# Patient Record
Sex: Male | Born: 1975 | Race: White | Hispanic: No | Marital: Married | Smoking: Never smoker
Health system: Southern US, Community
[De-identification: ages and names within clinical notes are randomized; demographics above are authoritative.]

## PROBLEM LIST (undated history)

## (undated) HISTORY — PX: SHOULDER ARTHROTOMY: SUR111

## (undated) HISTORY — PX: HERNIA REPAIR: SHX51

## (undated) HISTORY — PX: BILATERAL HEMI SHOULDER ARTHROPLASTY: SHX6442

## (undated) HISTORY — PX: TONSILLECTOMY: SUR1361

## (undated) HISTORY — PX: SHOULDER ARTHROSCOPY: SHX128

---

## 2013-06-24 DIAGNOSIS — G4700 Insomnia, unspecified: Secondary | ICD-10-CM | POA: Insufficient documentation

## 2013-06-24 DIAGNOSIS — M25519 Pain in unspecified shoulder: Secondary | ICD-10-CM | POA: Insufficient documentation

## 2017-04-09 DIAGNOSIS — H7292 Unspecified perforation of tympanic membrane, left ear: Secondary | ICD-10-CM | POA: Insufficient documentation

## 2017-12-01 DIAGNOSIS — Z9889 Other specified postprocedural states: Secondary | ICD-10-CM | POA: Insufficient documentation

## 2017-12-01 DIAGNOSIS — M25511 Pain in right shoulder: Secondary | ICD-10-CM | POA: Insufficient documentation

## 2018-03-29 DIAGNOSIS — M19011 Primary osteoarthritis, right shoulder: Secondary | ICD-10-CM | POA: Insufficient documentation

## 2019-07-04 ENCOUNTER — Other Ambulatory Visit (HOSPITAL_COMMUNITY)
Admission: RE | Admit: 2019-07-04 | Discharge: 2019-07-04 | Disposition: A | Discharge status: Home / Self Care | Payer: Self-pay | Source: Other Acute Inpatient Hospital

## 2019-07-04 DIAGNOSIS — Z96611 Presence of right artificial shoulder joint: Secondary | ICD-10-CM | POA: Insufficient documentation

## 2019-07-04 LAB — CBC WITH DIFFERENTIAL/PLATELET
Abs Immature Granulocytes: 0.01 10*3/uL (ref 0.00–0.07)
Basophils Absolute: 0.1 10*3/uL (ref 0.0–0.1)
Basophils Relative: 2 %
Eosinophils Absolute: 0.3 10*3/uL (ref 0.0–0.5)
Eosinophils Relative: 5 %
HCT: 40.2 % (ref 39.0–52.0)
Hemoglobin: 13.2 g/dL (ref 13.0–17.0)
Immature Granulocytes: 0 %
Lymphocytes Relative: 35 %
Lymphs Abs: 1.9 10*3/uL (ref 0.7–4.0)
MCH: 28.8 pg (ref 26.0–34.0)
MCHC: 32.8 g/dL (ref 30.0–36.0)
MCV: 87.8 fL (ref 80.0–100.0)
Monocytes Absolute: 0.4 10*3/uL (ref 0.1–1.0)
Monocytes Relative: 8 %
Neutro Abs: 2.8 10*3/uL (ref 1.7–7.7)
Neutrophils Relative %: 50 %
Platelets: 328 10*3/uL (ref 150–400)
RBC: 4.58 MIL/uL (ref 4.22–5.81)
RDW: 12.2 % (ref 11.5–15.5)
WBC: 5.5 10*3/uL (ref 4.0–10.5)
nRBC: 0 % (ref 0.0–0.2)

## 2019-07-04 LAB — COMPREHENSIVE METABOLIC PANEL
ALT: 30 U/L (ref 0–44)
AST: 22 U/L (ref 15–41)
Albumin: 4.3 g/dL (ref 3.5–5.0)
Alkaline Phosphatase: 54 U/L (ref 38–126)
Anion gap: 9 (ref 5–15)
BUN: 20 mg/dL (ref 6–20)
CO2: 26 mmol/L (ref 22–32)
Calcium: 9.2 mg/dL (ref 8.9–10.3)
Chloride: 105 mmol/L (ref 98–111)
Creatinine, Ser: 0.94 mg/dL (ref 0.61–1.24)
GFR calc Af Amer: >60 mL/min (ref >60)
GFR calc non Af Amer: >60 mL/min (ref >60)
Glucose, Bld: 111 mg/dL — ABNORMAL HIGH (ref 70–99)
Potassium: 3.7 mmol/L (ref 3.5–5.1)
Sodium: 140 mmol/L (ref 135–145)
Total Bilirubin: 0.3 mg/dL (ref 0.3–1.2)
Total Protein: 7.3 g/dL (ref 6.5–8.1)

## 2019-07-04 LAB — SEDIMENTATION RATE: Sed Rate: 1 mm/hr (ref 0–16)

## 2019-07-04 LAB — C-REACTIVE PROTEIN: CRP: 1 mg/dL — ABNORMAL HIGH (ref <1.0)

## 2019-07-11 ENCOUNTER — Other Ambulatory Visit (HOSPITAL_COMMUNITY)
Admission: RE | Admit: 2019-07-11 | Discharge: 2019-07-11 | Disposition: A | Discharge status: Home / Self Care | Payer: Self-pay | Source: Other Acute Inpatient Hospital | Attending: Surgical | Admitting: Surgical

## 2019-07-11 DIAGNOSIS — Z029 Encounter for administrative examinations, unspecified: Secondary | ICD-10-CM | POA: Insufficient documentation

## 2019-07-11 LAB — COMPREHENSIVE METABOLIC PANEL
ALT: 52 U/L — ABNORMAL HIGH (ref 0–44)
AST: 35 U/L (ref 15–41)
Albumin: 4.6 g/dL (ref 3.5–5.0)
Alkaline Phosphatase: 57 U/L (ref 38–126)
Anion gap: 12 (ref 5–15)
BUN: 23 mg/dL — ABNORMAL HIGH (ref 6–20)
CO2: 25 mmol/L (ref 22–32)
Calcium: 9.4 mg/dL (ref 8.9–10.3)
Chloride: 103 mmol/L (ref 98–111)
Creatinine, Ser: 1.01 mg/dL (ref 0.61–1.24)
GFR calc Af Amer: >60 mL/min (ref >60)
GFR calc non Af Amer: >60 mL/min (ref >60)
Glucose, Bld: 91 mg/dL (ref 70–99)
Potassium: 3.8 mmol/L (ref 3.5–5.1)
Sodium: 140 mmol/L (ref 135–145)
Total Bilirubin: 0.5 mg/dL (ref 0.3–1.2)
Total Protein: 7.1 g/dL (ref 6.5–8.1)

## 2019-07-11 LAB — CBC WITH DIFFERENTIAL/PLATELET
Abs Immature Granulocytes: 0.01 10*3/uL (ref 0.00–0.07)
Basophils Absolute: 0.1 10*3/uL (ref 0.0–0.1)
Basophils Relative: 1 %
Eosinophils Absolute: 0.3 10*3/uL (ref 0.0–0.5)
Eosinophils Relative: 4 %
HCT: 39.8 % (ref 39.0–52.0)
Hemoglobin: 13.4 g/dL (ref 13.0–17.0)
Immature Granulocytes: 0 %
Lymphocytes Relative: 30 %
Lymphs Abs: 1.9 10*3/uL (ref 0.7–4.0)
MCH: 29.3 pg (ref 26.0–34.0)
MCHC: 33.7 g/dL (ref 30.0–36.0)
MCV: 86.9 fL (ref 80.0–100.0)
Monocytes Absolute: 0.6 10*3/uL (ref 0.1–1.0)
Monocytes Relative: 9 %
Neutro Abs: 3.6 10*3/uL (ref 1.7–7.7)
Neutrophils Relative %: 56 %
Platelets: 293 10*3/uL (ref 150–400)
RBC: 4.58 MIL/uL (ref 4.22–5.81)
RDW: 12.4 % (ref 11.5–15.5)
WBC: 6.4 10*3/uL (ref 4.0–10.5)
nRBC: 0 % (ref 0.0–0.2)

## 2019-07-11 LAB — C-REACTIVE PROTEIN: CRP: 0.8 mg/dL (ref <1.0)

## 2019-07-12 LAB — SEDIMENTATION RATE: Sed Rate: 1 mm/hr (ref 0–16)

## 2019-07-18 ENCOUNTER — Other Ambulatory Visit (HOSPITAL_COMMUNITY)
Admission: RE | Admit: 2019-07-18 | Discharge: 2019-07-18 | Disposition: A | Discharge status: Home / Self Care | Payer: Self-pay | Source: Ambulatory Visit | Attending: Surgical | Admitting: Surgical

## 2019-07-18 DIAGNOSIS — Z96611 Presence of right artificial shoulder joint: Secondary | ICD-10-CM | POA: Insufficient documentation

## 2019-07-18 LAB — CBC WITH DIFFERENTIAL/PLATELET
Abs Immature Granulocytes: 0.01 10*3/uL (ref 0.00–0.07)
Basophils Absolute: 0.1 10*3/uL (ref 0.0–0.1)
Basophils Relative: 1 %
Eosinophils Absolute: 0.3 10*3/uL (ref 0.0–0.5)
Eosinophils Relative: 8 %
HCT: 40.2 % (ref 39.0–52.0)
Hemoglobin: 13.6 g/dL (ref 13.0–17.0)
Immature Granulocytes: 0 %
Lymphocytes Relative: 41 %
Lymphs Abs: 1.9 10*3/uL (ref 0.7–4.0)
MCH: 29.3 pg (ref 26.0–34.0)
MCHC: 33.8 g/dL (ref 30.0–36.0)
MCV: 86.6 fL (ref 80.0–100.0)
Monocytes Absolute: 0.5 10*3/uL (ref 0.1–1.0)
Monocytes Relative: 10 %
Neutro Abs: 1.8 10*3/uL (ref 1.7–7.7)
Neutrophils Relative %: 40 %
Platelets: 231 10*3/uL (ref 150–400)
RBC: 4.64 MIL/uL (ref 4.22–5.81)
RDW: 12.6 % (ref 11.5–15.5)
WBC: 4.5 10*3/uL (ref 4.0–10.5)
nRBC: 0 % (ref 0.0–0.2)

## 2019-07-18 LAB — COMPREHENSIVE METABOLIC PANEL
ALT: 44 U/L (ref 0–44)
AST: 28 U/L (ref 15–41)
Albumin: 4.4 g/dL (ref 3.5–5.0)
Alkaline Phosphatase: 57 U/L (ref 38–126)
Anion gap: 8 (ref 5–15)
BUN: 20 mg/dL (ref 6–20)
CO2: 25 mmol/L (ref 22–32)
Calcium: 9.3 mg/dL (ref 8.9–10.3)
Chloride: 107 mmol/L (ref 98–111)
Creatinine, Ser: 0.98 mg/dL (ref 0.61–1.24)
GFR calc Af Amer: >60 mL/min (ref >60)
GFR calc non Af Amer: >60 mL/min (ref >60)
Glucose, Bld: 137 mg/dL — ABNORMAL HIGH (ref 70–99)
Potassium: 3.5 mmol/L (ref 3.5–5.1)
Sodium: 140 mmol/L (ref 135–145)
Total Bilirubin: 0.2 mg/dL — ABNORMAL LOW (ref 0.3–1.2)
Total Protein: 7.1 g/dL (ref 6.5–8.1)

## 2019-07-18 LAB — SEDIMENTATION RATE: Sed Rate: 2 mm/hr (ref 0–16)

## 2019-07-18 LAB — C-REACTIVE PROTEIN: CRP: 0.9 mg/dL (ref <1.0)

## 2019-07-25 ENCOUNTER — Other Ambulatory Visit (HOSPITAL_COMMUNITY)
Admission: RE | Admit: 2019-07-25 | Discharge: 2019-07-25 | Disposition: A | Discharge status: Home / Self Care | Payer: Self-pay | Source: Other Acute Inpatient Hospital | Attending: Surgical | Admitting: Surgical

## 2019-07-25 DIAGNOSIS — Z96611 Presence of right artificial shoulder joint: Secondary | ICD-10-CM | POA: Insufficient documentation

## 2019-07-25 LAB — COMPREHENSIVE METABOLIC PANEL
ALT: 50 U/L — ABNORMAL HIGH (ref 0–44)
AST: 30 U/L (ref 15–41)
Albumin: 4.2 g/dL (ref 3.5–5.0)
Alkaline Phosphatase: 54 U/L (ref 38–126)
Anion gap: 10 (ref 5–15)
BUN: 15 mg/dL (ref 6–20)
CO2: 26 mmol/L (ref 22–32)
Calcium: 9.2 mg/dL (ref 8.9–10.3)
Chloride: 105 mmol/L (ref 98–111)
Creatinine, Ser: 1.06 mg/dL (ref 0.61–1.24)
GFR calc Af Amer: >60 mL/min (ref >60)
GFR calc non Af Amer: >60 mL/min (ref >60)
Glucose, Bld: 105 mg/dL — ABNORMAL HIGH (ref 70–99)
Potassium: 3.9 mmol/L (ref 3.5–5.1)
Sodium: 141 mmol/L (ref 135–145)
Total Bilirubin: 0.5 mg/dL (ref 0.3–1.2)
Total Protein: 6.7 g/dL (ref 6.5–8.1)

## 2019-07-25 LAB — CBC WITH DIFFERENTIAL/PLATELET
Abs Immature Granulocytes: 0.02 10*3/uL (ref 0.00–0.07)
Basophils Absolute: 0 10*3/uL (ref 0.0–0.1)
Basophils Relative: 1 %
Eosinophils Absolute: 0.3 10*3/uL (ref 0.0–0.5)
Eosinophils Relative: 5 %
HCT: 38.3 % — ABNORMAL LOW (ref 39.0–52.0)
Hemoglobin: 12.9 g/dL — ABNORMAL LOW (ref 13.0–17.0)
Immature Granulocytes: 0 %
Lymphocytes Relative: 35 %
Lymphs Abs: 1.7 10*3/uL (ref 0.7–4.0)
MCH: 28.7 pg (ref 26.0–34.0)
MCHC: 33.7 g/dL (ref 30.0–36.0)
MCV: 85.3 fL (ref 80.0–100.0)
Monocytes Absolute: 0.4 10*3/uL (ref 0.1–1.0)
Monocytes Relative: 8 %
Neutro Abs: 2.5 10*3/uL (ref 1.7–7.7)
Neutrophils Relative %: 51 %
Platelets: 254 10*3/uL (ref 150–400)
RBC: 4.49 MIL/uL (ref 4.22–5.81)
RDW: 12.6 % (ref 11.5–15.5)
WBC: 5 10*3/uL (ref 4.0–10.5)
nRBC: 0 % (ref 0.0–0.2)

## 2019-07-25 LAB — SEDIMENTATION RATE: Sed Rate: 1 mm/hr (ref 0–16)

## 2019-09-28 DIAGNOSIS — G894 Chronic pain syndrome: Secondary | ICD-10-CM | POA: Insufficient documentation

## 2019-12-02 DIAGNOSIS — Z96611 Presence of right artificial shoulder joint: Secondary | ICD-10-CM | POA: Insufficient documentation

## 2020-07-10 ENCOUNTER — Other Ambulatory Visit: Payer: Self-pay | Admitting: Orthopedic Surgery

## 2020-07-31 NOTE — Progress Notes (Signed)
Pt. Needs orders for upcomming surgery.PST and lab. appointment on: 08/01/20. Thanks.

## 2020-07-31 NOTE — Patient Instructions (Addendum)
DUE TO COVID-19 ONLY ONE VISITOR IS ALLOWED TO COME WITH YOU AND STAY IN THE WAITING ROOM ONLY DURING PRE OP AND PROCEDURE DAY OF SURGERY. THE 1 VISITOR  MAY VISIT WITH YOU AFTER SURGERY IN YOUR PRIVATE ROOM DURING VISITING HOURS ONLY!  YOU NEED TO HAVE A COVID 19 TEST ON: 08/06/20 @ 9:00 AM , THIS TEST MUST BE DONE BEFORE SURGERY,  COVID TESTING SITE [address] [place] [address], IT IS ON THE RIGHT GOING OUT [address] APPROXIMATELY  2 MINUTES PAST [employer] ON THE RIGHT. ONCE YOUR COVID TEST IS COMPLETED,  PLEASE BEGIN THE QUARANTINE INSTRUCTIONS AS OUTLINED IN YOUR HANDOUT.                Darren Caldwell    Your procedure is scheduled on: 08/09/20   Report to [facility] Main  Entrance   Report to short stay at: 5:30 AM     Call this number if you have problems the morning of surgery [phone]    Remember: Do not eat food or drink liquids :After Midnight.   BRUSH YOUR TEETH MORNING OF SURGERY AND RINSE YOUR MOUTH OUT, NO CHEWING GUM CANDY OR MINTS.                                You may not have any metal on your body including hair pins and              piercings  Do not wear jewelry, lotions, powders or perfumes, deodorant             Men may shave face and neck.   Do not bring valuables to the hospital. [facility] IS NOT             RESPONSIBLE   FOR VALUABLES.  Contacts, dentures or bridgework may not be worn into surgery.  Leave suitcase in the car. After surgery it may be brought to your room.     Patients discharged the day of surgery will not be allowed to drive home. IF YOU ARE HAVING SURGERY AND GOING HOME THE SAME DAY, YOU MUST HAVE AN ADULT TO DRIVE YOU HOME AND BE WITH YOU FOR 24 HOURS. YOU MAY GO HOME BY TAXI OR UBER OR ORTHERWISE, BUT AN ADULT MUST ACCOMPANY YOU HOME AND STAY WITH YOU FOR 24 HOURS.  Name and phone number of your driver:  Special Instructions: N/A              Please read over the following fact sheets  you were given: _____________________________________________________________________         [facility] - Preparing for Surgery Before surgery, you can play an important role.  Because skin is not sterile, your skin needs to be as free of germs as possible.  You can reduce the number of germs on your skin by washing with CHG (chlorahexidine gluconate) soap before surgery.  CHG is an antiseptic cleaner which kills germs and bonds with the skin to continue killing germs even after washing. Please DO NOT use if you have an allergy to CHG or antibacterial soaps.  If your skin becomes reddened/irritated stop using the CHG and inform your nurse when you arrive at Short Stay. Do not shave (including legs and underarms) for at least 48 hours prior to the first CHG shower.  You may shave your face/neck. Please follow these instructions carefully:  1.  Shower with CHG Soap the night before surgery and the  morning of Surgery.  2.  If you choose to wash your hair, wash your hair first as usual with your  normal  shampoo.  3.  After you shampoo, rinse your hair and body thoroughly to remove the  shampoo.                           4.  Use CHG as you would any other liquid soap.  You can apply chg directly  to the skin and wash                       Gently with a scrungie or clean washcloth.  5.  Apply the CHG Soap to your body ONLY FROM THE NECK DOWN.   Do not use on face/ open                           Wound or open sores. Avoid contact with eyes, ears mouth and genitals (private parts).                       Wash face,  Genitals (private parts) with your normal soap.             6.  Wash thoroughly, paying special attention to the area where your surgery  will be performed.  7.  Thoroughly rinse your body with warm water from the neck down.  8.  DO NOT shower/wash with your normal soap after using and rinsing off  the CHG Soap.                9.  Pat yourself dry with a clean towel.            10.  Wear  clean pajamas.            11.  Place clean sheets on your bed the night of your first shower and do not  sleep with pets. Day of Surgery : Do not apply any lotions/deodorants the morning of surgery.  Please wear clean clothes to the hospital/surgery center.  FAILURE TO FOLLOW THESE INSTRUCTIONS MAY RESULT IN THE CANCELLATION OF YOUR SURGERY PATIENT SIGNATURE_________________________________  NURSE SIGNATURE__________________________________  ________________________________________________________________________  [facility]- Preparing for Total Shoulder Arthroplasty    Before surgery, you can play an important role. Because skin is not sterile, your skin needs to be as free of germs as possible. You can reduce the number of germs on your skin by using the following products. . Benzoyl Peroxide Gel o Reduces the number of germs present on the skin o Applied twice a day to shoulder area starting two days before surgery    ==================================================================  Please follow these instructions carefully:  BENZOYL PEROXIDE 5% GEL  Please do not use if you have an allergy to benzoyl peroxide.   If your skin becomes reddened/irritated stop using the benzoyl peroxide.  Starting two days before surgery, apply as follows: 1. Apply benzoyl peroxide in the morning and at night. Apply after taking a shower. If you are not taking a shower clean entire shoulder front, back, and side along with the armpit with a clean wet washcloth.  2. Place a quarter-sized dollop on your shoulder and rub in thoroughly, making sure to cover the front, back, and side of your shoulder, along with the armpit.   2  days before ____ AM   ____ PM              1 day before ____ AM   ____ PM                         3. Do this twice a day for two days.  (Last application is the night before surgery, AFTER using the CHG soap as described below).  4. Do NOT apply benzoyl peroxide gel on the  day of surgery.

## 2020-08-01 ENCOUNTER — Encounter (HOSPITAL_COMMUNITY): Payer: Self-pay

## 2020-08-01 ENCOUNTER — Other Ambulatory Visit: Payer: Self-pay

## 2020-08-01 ENCOUNTER — Encounter (HOSPITAL_COMMUNITY)
Admission: RE | Admit: 2020-08-01 | Discharge: 2020-08-01 | Disposition: A | Discharge status: Home / Self Care | Payer: 59 | Source: Ambulatory Visit | Attending: Orthopedic Surgery | Admitting: Orthopedic Surgery

## 2020-08-01 DIAGNOSIS — Z01812 Encounter for preprocedural laboratory examination: Secondary | ICD-10-CM | POA: Diagnosis not present

## 2020-08-01 LAB — CBC
HCT: 43.4 % (ref 39.0–52.0)
Hemoglobin: 14.9 g/dL (ref 13.0–17.0)
MCH: 30.3 pg (ref 26.0–34.0)
MCHC: 34.3 g/dL (ref 30.0–36.0)
MCV: 88.4 fL (ref 80.0–100.0)
Platelets: 294 10*3/uL (ref 150–400)
RBC: 4.91 MIL/uL (ref 4.22–5.81)
RDW: 12.1 % (ref 11.5–15.5)
WBC: 6 10*3/uL (ref 4.0–10.5)
nRBC: 0 % (ref 0.0–0.2)

## 2020-08-01 LAB — SURGICAL PCR SCREEN
MRSA, PCR: NEGATIVE
Staphylococcus aureus: POSITIVE — AB

## 2020-08-01 NOTE — Progress Notes (Signed)
COVID Vaccine Completed: yES Date COVID Vaccine completed: 01/19/20 COVID vaccine manufacturer:    Moderna    PCP - nO pcp Cardiologist -   Chest x-ray -  EKG -  Stress Test -  ECHO -  Cardiac Cath -  Pacemaker/ICD device last checked:  Sleep Study -  CPAP -   Fasting Blood Sugar -  Checks Blood Sugar _____ times a day  Blood Thinner Instructions: Aspirin Instructions: Last Dose:  Anesthesia review:   Patient denies shortness of breath, fever, cough and chest pain at PAT appointment   Patient verbalized understanding of instructions that were given to them at the PAT appointment. Patient was also instructed that they will need to review over the PAT instructions again at home before surgery.

## 2020-08-02 NOTE — Progress Notes (Signed)
PCR: POSITIVE STAPH. 

## 2020-08-06 ENCOUNTER — Other Ambulatory Visit (HOSPITAL_COMMUNITY)
Admission: RE | Admit: 2020-08-06 | Discharge: 2020-08-06 | Disposition: A | Discharge status: Home / Self Care | Payer: 59 | Source: Ambulatory Visit | Attending: Orthopedic Surgery | Admitting: Orthopedic Surgery

## 2020-08-06 DIAGNOSIS — Z01812 Encounter for preprocedural laboratory examination: Secondary | ICD-10-CM | POA: Insufficient documentation

## 2020-08-06 DIAGNOSIS — Z20822 Contact with and (suspected) exposure to covid-19: Secondary | ICD-10-CM | POA: Insufficient documentation

## 2020-08-06 LAB — SARS CORONAVIRUS 2 (TAT 6-24 HRS): SARS Coronavirus 2: NEGATIVE

## 2020-08-08 ENCOUNTER — Other Ambulatory Visit: Payer: Self-pay | Admitting: Orthopedic Surgery

## 2020-08-08 NOTE — Progress Notes (Signed)
Called [facility] and requested orders.

## 2020-08-09 ENCOUNTER — Ambulatory Visit (HOSPITAL_COMMUNITY): Payer: 59 | Admitting: Certified Registered"

## 2020-08-09 ENCOUNTER — Encounter (HOSPITAL_COMMUNITY): Payer: Self-pay | Admitting: Orthopedic Surgery

## 2020-08-09 ENCOUNTER — Ambulatory Visit (HOSPITAL_COMMUNITY): Payer: 59

## 2020-08-09 ENCOUNTER — Ambulatory Visit (HOSPITAL_COMMUNITY)
Admission: RE | Admit: 2020-08-09 | Discharge: 2020-08-09 | Disposition: A | Discharge status: Home / Self Care | Payer: 59 | Source: Ambulatory Visit | Attending: Orthopedic Surgery | Admitting: Orthopedic Surgery

## 2020-08-09 ENCOUNTER — Encounter (HOSPITAL_COMMUNITY)
Admission: RE | Disposition: A | Discharge status: Home / Self Care | Payer: Self-pay | Source: Ambulatory Visit | Attending: Orthopedic Surgery

## 2020-08-09 DIAGNOSIS — Z96611 Presence of right artificial shoulder joint: Secondary | ICD-10-CM | POA: Diagnosis not present

## 2020-08-09 DIAGNOSIS — M25511 Pain in right shoulder: Secondary | ICD-10-CM | POA: Insufficient documentation

## 2020-08-09 DIAGNOSIS — M25311 Other instability, right shoulder: Secondary | ICD-10-CM | POA: Insufficient documentation

## 2020-08-09 DIAGNOSIS — M25811 Other specified joint disorders, right shoulder: Secondary | ICD-10-CM | POA: Insufficient documentation

## 2020-08-09 DIAGNOSIS — Z01811 Encounter for preprocedural respiratory examination: Secondary | ICD-10-CM

## 2020-08-09 HISTORY — PX: REVISION TOTAL SHOULDER TO REVERSE TOTAL SHOULDER: SHX6313

## 2020-08-09 LAB — CBC WITH DIFFERENTIAL/PLATELET
Abs Immature Granulocytes: 0.02 10*3/uL (ref 0.00–0.07)
Basophils Absolute: 0.1 10*3/uL (ref 0.0–0.1)
Basophils Relative: 1 %
Eosinophils Absolute: 0.3 10*3/uL (ref 0.0–0.5)
Eosinophils Relative: 4 %
HCT: 42.9 % (ref 39.0–52.0)
Hemoglobin: 15 g/dL (ref 13.0–17.0)
Immature Granulocytes: 0 %
Lymphocytes Relative: 40 %
Lymphs Abs: 2.7 10*3/uL (ref 0.7–4.0)
MCH: 30.6 pg (ref 26.0–34.0)
MCHC: 35 g/dL (ref 30.0–36.0)
MCV: 87.6 fL (ref 80.0–100.0)
Monocytes Absolute: 0.6 10*3/uL (ref 0.1–1.0)
Monocytes Relative: 9 %
Neutro Abs: 3.2 10*3/uL (ref 1.7–7.7)
Neutrophils Relative %: 46 %
Platelets: 256 10*3/uL (ref 150–400)
RBC: 4.9 MIL/uL (ref 4.22–5.81)
RDW: 12.3 % (ref 11.5–15.5)
WBC: 6.9 10*3/uL (ref 4.0–10.5)
nRBC: 0 % (ref 0.0–0.2)

## 2020-08-09 LAB — URINALYSIS, COMPLETE (UACMP) WITH MICROSCOPIC
Bilirubin Urine: NEGATIVE
Glucose, UA: NEGATIVE mg/dL
Hgb urine dipstick: NEGATIVE
Ketones, ur: NEGATIVE mg/dL
Leukocytes,Ua: NEGATIVE
Nitrite: NEGATIVE
Protein, ur: NEGATIVE mg/dL
Specific Gravity, Urine: 1.021 (ref 1.005–1.030)
pH: 5 (ref 5.0–8.0)

## 2020-08-09 LAB — COMPREHENSIVE METABOLIC PANEL
ALT: 47 U/L — ABNORMAL HIGH (ref 0–44)
AST: 29 U/L (ref 15–41)
Albumin: 4.4 g/dL (ref 3.5–5.0)
Alkaline Phosphatase: 47 U/L (ref 38–126)
Anion gap: 12 (ref 5–15)
BUN: 19 mg/dL (ref 6–20)
CO2: 26 mmol/L (ref 22–32)
Calcium: 9.8 mg/dL (ref 8.9–10.3)
Chloride: 104 mmol/L (ref 98–111)
Creatinine, Ser: 1.08 mg/dL (ref 0.61–1.24)
GFR, Estimated: >60 mL/min (ref >60)
Glucose, Bld: 101 mg/dL — ABNORMAL HIGH (ref 70–99)
Potassium: 4.1 mmol/L (ref 3.5–5.1)
Sodium: 142 mmol/L (ref 135–145)
Total Bilirubin: 0.7 mg/dL (ref 0.3–1.2)
Total Protein: 7.2 g/dL (ref 6.5–8.1)

## 2020-08-09 LAB — ABO/RH: ABO/RH(D): O POS

## 2020-08-09 LAB — PROTIME-INR
INR: 1 (ref 0.8–1.2)
Prothrombin Time: 12.5 seconds (ref 11.4–15.2)

## 2020-08-09 LAB — TYPE AND SCREEN
ABO/RH(D): O POS
Antibody Screen: NEGATIVE

## 2020-08-09 LAB — APTT: aPTT: 31 seconds (ref 24–36)

## 2020-08-09 SURGERY — REVISION, REVERSE TOTAL ARTHROPLASTY, SHOULDER
Anesthesia: General | Site: Shoulder | Laterality: Right

## 2020-08-09 MED ORDER — PHENYLEPHRINE HCL-NACL 10-0.9 MG/250ML-% IV SOLN
INTRAVENOUS | Status: AC
  Filled 2020-08-09: qty 750

## 2020-08-09 MED ORDER — BUPIVACAINE HCL 0.5 % IJ SOLN
INTRAMUSCULAR | Status: DC | PRN
  Administered 2020-08-09: 15 mL

## 2020-08-09 MED ORDER — LACTATED RINGERS IV SOLN: INTRAVENOUS | Status: DC

## 2020-08-09 MED ORDER — MIDAZOLAM HCL 2 MG/2ML IJ SOLN
INTRAMUSCULAR | Status: DC | PRN
  Administered 2020-08-09: 2 mg via INTRAVENOUS

## 2020-08-09 MED ORDER — ONDANSETRON HCL 4 MG/2ML IJ SOLN
INTRAMUSCULAR | Status: DC | PRN
  Administered 2020-08-09: 4 mg via INTRAVENOUS

## 2020-08-09 MED ORDER — PHENYLEPHRINE 40 MCG/ML (10ML) SYRINGE FOR IV PUSH (FOR BLOOD PRESSURE SUPPORT)
PREFILLED_SYRINGE | INTRAVENOUS | Status: AC
  Filled 2020-08-09: qty 10

## 2020-08-09 MED ORDER — BUPIVACAINE HCL 0.5 % IJ SOLN: INTRAMUSCULAR | Status: DC | PRN

## 2020-08-09 MED ORDER — HYDROMORPHONE HCL 1 MG/ML IJ SOLN: 0.2500 mg | INTRAMUSCULAR | Status: DC | PRN

## 2020-08-09 MED ORDER — CEFAZOLIN SODIUM-DEXTROSE 2-4 GM/100ML-% IV SOLN
INTRAVENOUS | Status: AC
  Filled 2020-08-09: qty 100

## 2020-08-09 MED ORDER — EPHEDRINE SULFATE-NACL 50-0.9 MG/10ML-% IV SOSY
PREFILLED_SYRINGE | INTRAVENOUS | Status: DC | PRN
  Administered 2020-08-09 (×4): 5 mg via INTRAVENOUS

## 2020-08-09 MED ORDER — DEXAMETHASONE SODIUM PHOSPHATE 10 MG/ML IJ SOLN
INTRAMUSCULAR | Status: AC
  Filled 2020-08-09: qty 1

## 2020-08-09 MED ORDER — ORAL CARE MOUTH RINSE: 15.0000 mL | Freq: Once | OROMUCOSAL | Status: AC

## 2020-08-09 MED ORDER — ONDANSETRON HCL 4 MG/2ML IJ SOLN
INTRAMUSCULAR | Status: AC
  Filled 2020-08-09: qty 2

## 2020-08-09 MED ORDER — LIDOCAINE 2% (20 MG/ML) 5 ML SYRINGE
INTRAMUSCULAR | Status: DC | PRN
  Administered 2020-08-09: 40 mg via INTRAVENOUS

## 2020-08-09 MED ORDER — PHENYLEPHRINE HCL-NACL 10-0.9 MG/250ML-% IV SOLN
INTRAVENOUS | Status: DC | PRN
  Administered 2020-08-09: 20 ug/min via INTRAVENOUS

## 2020-08-09 MED ORDER — SUGAMMADEX SODIUM 200 MG/2ML IV SOLN
INTRAVENOUS | Status: DC | PRN
  Administered 2020-08-09: 200 mg via INTRAVENOUS

## 2020-08-09 MED ORDER — LACTATED RINGERS IV BOLUS
500.0000 mL | Freq: Once | INTRAVENOUS | Status: AC
  Administered 2020-08-09: 500 mL via INTRAVENOUS

## 2020-08-09 MED ORDER — ROCURONIUM BROMIDE 10 MG/ML (PF) SYRINGE
PREFILLED_SYRINGE | INTRAVENOUS | Status: AC
  Filled 2020-08-09: qty 10

## 2020-08-09 MED ORDER — PROPOFOL 10 MG/ML IV BOLUS
INTRAVENOUS | Status: AC
  Filled 2020-08-09: qty 40

## 2020-08-09 MED ORDER — FENTANYL CITRATE (PF) 100 MCG/2ML IJ SOLN
INTRAMUSCULAR | Status: AC
  Filled 2020-08-09: qty 2

## 2020-08-09 MED ORDER — ACETAMINOPHEN 325 MG PO TABS: 325.0000 mg | ORAL_TABLET | Freq: Once | ORAL | Status: DC | PRN

## 2020-08-09 MED ORDER — PROPOFOL 10 MG/ML IV BOLUS
INTRAVENOUS | Status: DC | PRN
  Administered 2020-08-09: 160 mg via INTRAVENOUS

## 2020-08-09 MED ORDER — CHLORHEXIDINE GLUCONATE 0.12 % MT SOLN
15.0000 mL | Freq: Once | OROMUCOSAL | Status: AC
  Administered 2020-08-09: 15 mL via OROMUCOSAL

## 2020-08-09 MED ORDER — DEXAMETHASONE SODIUM PHOSPHATE 10 MG/ML IJ SOLN
INTRAMUSCULAR | Status: DC | PRN
  Administered 2020-08-09: 10 mg via INTRAVENOUS

## 2020-08-09 MED ORDER — ACETAMINOPHEN 160 MG/5ML PO SOLN: 325.0000 mg | Freq: Once | ORAL | Status: DC | PRN

## 2020-08-09 MED ORDER — BUPIVACAINE LIPOSOME 1.3 % IJ SUSP: INTRAMUSCULAR | Status: DC | PRN

## 2020-08-09 MED ORDER — BUPIVACAINE LIPOSOME 1.3 % IJ SUSP
INTRAMUSCULAR | Status: DC | PRN
  Administered 2020-08-09: 10 mL via PERINEURAL

## 2020-08-09 MED ORDER — OXYCODONE-ACETAMINOPHEN 5-325 MG PO TABS: ORAL_TABLET | ORAL | 0 refills | Status: AC

## 2020-08-09 MED ORDER — FENTANYL CITRATE (PF) 250 MCG/5ML IJ SOLN
INTRAMUSCULAR | Status: DC | PRN
  Administered 2020-08-09 (×2): 50 ug via INTRAVENOUS

## 2020-08-09 MED ORDER — TIZANIDINE HCL 4 MG PO TABS: 4.0000 mg | ORAL_TABLET | Freq: Three times a day (TID) | ORAL | 1 refills | Status: AC | PRN

## 2020-08-09 MED ORDER — PHENYLEPHRINE 40 MCG/ML (10ML) SYRINGE FOR IV PUSH (FOR BLOOD PRESSURE SUPPORT)
PREFILLED_SYRINGE | INTRAVENOUS | Status: DC | PRN
  Administered 2020-08-09: 40 ug via INTRAVENOUS
  Administered 2020-08-09 (×2): 80 ug via INTRAVENOUS

## 2020-08-09 MED ORDER — ROCURONIUM BROMIDE 10 MG/ML (PF) SYRINGE
PREFILLED_SYRINGE | INTRAVENOUS | Status: DC | PRN
  Administered 2020-08-09 (×6): 10 mg via INTRAVENOUS
  Administered 2020-08-09: 70 mg via INTRAVENOUS
  Administered 2020-08-09 (×2): 10 mg via INTRAVENOUS

## 2020-08-09 MED ORDER — AMISULPRIDE (ANTIEMETIC) 5 MG/2ML IV SOLN: 10.0000 mg | Freq: Once | INTRAVENOUS | Status: DC | PRN

## 2020-08-09 MED ORDER — LIDOCAINE 2% (20 MG/ML) 5 ML SYRINGE
INTRAMUSCULAR | Status: AC
  Filled 2020-08-09: qty 5

## 2020-08-09 MED ORDER — DOXYCYCLINE HYCLATE 50 MG PO CAPS: 100.0000 mg | ORAL_CAPSULE | Freq: Two times a day (BID) | ORAL | 0 refills | Status: DC

## 2020-08-09 MED ORDER — CEFAZOLIN SODIUM-DEXTROSE 2-4 GM/100ML-% IV SOLN
2.0000 g | INTRAVENOUS | Status: AC
  Administered 2020-08-09 (×2): 2 g via INTRAVENOUS

## 2020-08-09 MED ORDER — MEPERIDINE HCL 50 MG/ML IJ SOLN: 6.2500 mg | INTRAMUSCULAR | Status: DC | PRN

## 2020-08-09 MED ORDER — EPHEDRINE 5 MG/ML INJ
INTRAVENOUS | Status: AC
  Filled 2020-08-09: qty 10

## 2020-08-09 MED ORDER — TRANEXAMIC ACID-NACL 1000-0.7 MG/100ML-% IV SOLN
1000.0000 mg | INTRAVENOUS | Status: AC
  Administered 2020-08-09: 1000 mg via INTRAVENOUS

## 2020-08-09 MED ORDER — MIDAZOLAM HCL 2 MG/2ML IJ SOLN
INTRAMUSCULAR | Status: AC
  Filled 2020-08-09: qty 2

## 2020-08-09 MED ORDER — PHENYLEPHRINE HCL (PRESSORS) 10 MG/ML IV SOLN
INTRAVENOUS | Status: AC
  Filled 2020-08-09: qty 1

## 2020-08-09 MED ORDER — TRANEXAMIC ACID-NACL 1000-0.7 MG/100ML-% IV SOLN
INTRAVENOUS | Status: AC
  Filled 2020-08-09: qty 100

## 2020-08-09 MED ORDER — ACETAMINOPHEN 10 MG/ML IV SOLN: 1000.0000 mg | Freq: Once | INTRAVENOUS | Status: DC | PRN

## 2020-08-09 SURGICAL SUPPLY — 79 items
BAG ZIPLOCK 12X15 (MISCELLANEOUS) ×2 IMPLANT
BASEPLATE P2 COATD GLND 6.5X30 (Shoulder) ×1 IMPLANT
BIT DRILL 2.5 DIA 127 CALI (BIT) ×2 IMPLANT
BIT DRILL 4 DIA CALIBRATED (BIT) ×2 IMPLANT
BIT DRILL RSA EQUIN 2X3.2 (BIT) ×2 IMPLANT
BLADE MIC 41X13 (BLADE) IMPLANT
BLADE SAW SGTL 18X1.27X75 (BLADE) IMPLANT
BLADE SAW SGTL 83.5X18.5 (BLADE) ×2 IMPLANT
BLADE SURG 15 STRL LF DISP TIS (BLADE) ×1 IMPLANT
BLADE SURG 15 STRL SS (BLADE) ×1
BLADE SURG SZ10 CARB STEEL (BLADE) ×4 IMPLANT
CNTNR URN SCR LID CUP LEK RST (MISCELLANEOUS) IMPLANT
CONT SPEC 4OZ STRL OR WHT (MISCELLANEOUS)
COOLER ICEMAN CLASSIC (MISCELLANEOUS) ×2 IMPLANT
COVER SURGICAL LIGHT HANDLE (MISCELLANEOUS) ×2 IMPLANT
COVER WAND RF STERILE (DRAPES) IMPLANT
CUBES CANC 30CC PCANCUBE30 (Bone Implant) ×2 IMPLANT
DRAPE INCISE IOBAN 66X45 STRL (DRAPES) ×2 IMPLANT
DRAPE ORTHO SPLIT 77X108 STRL (DRAPES) ×2
DRAPE POUCH INSTRU U-SHP 10X18 (DRAPES) ×2 IMPLANT
DRAPE SURG 17X11 SM STRL (DRAPES) ×2 IMPLANT
DRAPE SURG ORHT 6 SPLT 77X108 (DRAPES) ×2 IMPLANT
DRAPE U-SHAPE 47X51 STRL (DRAPES) ×2 IMPLANT
DRSG AQUACEL AG ADV 3.5X 6 (GAUZE/BANDAGES/DRESSINGS) ×2 IMPLANT
DRSG MEPILEX BORDER 4X4 (GAUZE/BANDAGES/DRESSINGS) ×2 IMPLANT
DRSG MEPILEX BORDER 4X8 (GAUZE/BANDAGES/DRESSINGS) ×2 IMPLANT
DURAPREP 26ML APPLICATOR (WOUND CARE) ×2 IMPLANT
ELECT BLADE TIP CTD 4 INCH (ELECTRODE) ×2 IMPLANT
ELECT REM PT RETURN 15FT ADLT (MISCELLANEOUS) ×2 IMPLANT
EVACUATOR 1/8 PVC DRAIN (DRAIN) IMPLANT
FACESHIELD WRAPAROUND (MASK) ×4 IMPLANT
GLENOSPHERE RSA EQUINOXE W/CAP (Joint) ×2 IMPLANT
GLOVE BIO SURGEON STRL SZ7 (GLOVE) ×4 IMPLANT
GLOVE BIO SURGEON STRL SZ7.5 (GLOVE) ×4 IMPLANT
GLOVE BIOGEL PI IND STRL 7.0 (GLOVE) ×1 IMPLANT
GLOVE BIOGEL PI IND STRL 8 (GLOVE) ×1 IMPLANT
GLOVE BIOGEL PI INDICATOR 7.0 (GLOVE) ×1
GLOVE BIOGEL PI INDICATOR 8 (GLOVE) ×1
HANDPIECE INTERPULSE COAX TIP (DISPOSABLE) ×1
HEAD GLENOID W/SCREW 32MM (Shoulder) ×2 IMPLANT
HEMOSTAT SURGICEL 4X8 (HEMOSTASIS) IMPLANT
HOOD PEEL AWAY FLYTE STAYCOOL (MISCELLANEOUS) ×4 IMPLANT
INSERT EPOLY STND HUMERUS 36MM (Shoulder) ×2 IMPLANT
INSERT EPOLYSTD HUMERUS 36MM (Shoulder) ×1 IMPLANT
KIT BASIN OR (CUSTOM PROCEDURE TRAY) ×2 IMPLANT
KIT TURNOVER KIT A (KITS) IMPLANT
MANIFOLD NEPTUNE II (INSTRUMENTS) ×2 IMPLANT
NS IRRIG 1000ML POUR BTL (IV SOLUTION) ×2 IMPLANT
P2 COATDE GLNOID BSEPLT 6.5X30 (Shoulder) ×2 IMPLANT
PACK SHOULDER (CUSTOM PROCEDURE TRAY) ×2 IMPLANT
PAD COLD SHLDR WRAP-ON (PAD) ×2 IMPLANT
PLATE GLENOID EQUINOXE RSA (Plate) ×2 IMPLANT
PROTECTOR NERVE ULNAR (MISCELLANEOUS) ×2 IMPLANT
RESTRAINT HEAD UNIVERSAL NS (MISCELLANEOUS) ×2 IMPLANT
RSP HUMERAL SOCKET INSERT SZ 36MM STANDARD (Insert) ×2 IMPLANT
SCREW BONE LOCKING RSP 5.0X30 (Screw) ×4 IMPLANT
SCREW BONE RSP LOCK 5X18 (Screw) ×2 IMPLANT
SCREW BONE RSP LOCK 5X30 (Screw) ×2 IMPLANT
SCREW BONE RSP LOCKING 18MM LG (Screw) ×4 IMPLANT
SCREW LOCK COMPR EQ 4.5X22 (Screw) ×4 IMPLANT
SCREW LOCK COMPR EQ 4.5X34 (Screw) ×2 IMPLANT
SCREW LOCK COMPR EQ 4.5X38 (Screw) ×2 IMPLANT
SCREW LOCK GLENOID 15-05 (Screw) ×2 IMPLANT
SET HNDPC FAN SPRY TIP SCT (DISPOSABLE) ×1 IMPLANT
SLING ARM IMMOBILIZER LRG (SOFTGOODS) ×4 IMPLANT
SMARTMIX MINI TOWER (MISCELLANEOUS)
SPONGE LAP 18X18 RF (DISPOSABLE) ×4 IMPLANT
STEM REV PRIMARY 12X108 (Shoulder) ×2 IMPLANT
STRIP CLOSURE SKIN 1/2X4 (GAUZE/BANDAGES/DRESSINGS) ×4 IMPLANT
SUCTION FRAZIER HANDLE 12FR (TUBING) ×1
SUCTION TUBE FRAZIER 12FR DISP (TUBING) ×1 IMPLANT
SUPPORT WRAP ARM LG (MISCELLANEOUS) ×2 IMPLANT
SUT ETHIBOND 2 OS 4 DA (SUTURE) ×8 IMPLANT
SUT FIBERWIRE #2 38 T-5 BLUE (SUTURE) ×8
SUT MNCRL AB 4-0 PS2 18 (SUTURE) ×2 IMPLANT
SUT VIC AB 0 CT1 36 (SUTURE) ×4 IMPLANT
SUTURE FIBERWR #2 38 T-5 BLUE (SUTURE) ×4 IMPLANT
TOWER SMARTMIX MINI (MISCELLANEOUS) IMPLANT
WATER STERILE IRR 1000ML POUR (IV SOLUTION) ×2 IMPLANT

## 2020-08-09 NOTE — H&P (Signed)
Darren Caldwell is an 44 y.o. male.   Chief Complaint: R shoulder pain and dysfunction HPI: 44 year old male status post multiple surgeries on the right shoulder with failed right total shoulder arthroplasty.  Indicated for revision to reverse shoulder arthroplasty to try and salvage function and decrease pain.  History reviewed. No pertinent past medical history.  Past Surgical History:  Procedure Laterality Date  . BILATERAL HEMI SHOULDER ARTHROPLASTY Right   . HERNIA REPAIR    . SHOULDER ARTHROSCOPY    . SHOULDER ARTHROTOMY    . TONSILLECTOMY      History reviewed. No pertinent family history. Social History:  reports that he has never smoked. He has never used smokeless tobacco. He reports current alcohol use. He reports that he does not use drugs.  Allergies: No Known Allergies  Medications Prior to Admission  Medication Sig Dispense Refill  . acetaminophen (TYLENOL) 500 MG tablet Take 1,000 mg by mouth every 6 (six) hours as needed for moderate pain or headache.    . ibuprofen (ADVIL) 200 MG tablet Take 400 mg by mouth every 6 (six) hours as needed for headache or moderate pain.    . Multiple Vitamin (MULTIVITAMIN WITH MINERALS) TABS tablet Take 1 tablet by mouth 2 (two) times a week.      Results for orders placed or performed during the hospital encounter of 08/09/20 (from the past 48 hour(s))  Urinalysis, Complete w Microscopic     Status: Abnormal   Collection Time: 08/09/20  5:26 AM  Result Value Ref Range   Color, Urine YELLOW YELLOW   APPearance CLEAR CLEAR   Specific Gravity, Urine 1.021 1.005 - 1.030   pH 5.0 5.0 - 8.0   Glucose, UA NEGATIVE NEGATIVE mg/dL   Hgb urine dipstick NEGATIVE NEGATIVE   Bilirubin Urine NEGATIVE NEGATIVE   Ketones, ur NEGATIVE NEGATIVE mg/dL   Protein, ur NEGATIVE NEGATIVE mg/dL   Nitrite NEGATIVE NEGATIVE   Leukocytes,Ua NEGATIVE NEGATIVE   RBC / HPF 0-5 0 - 5 RBC/hpf   WBC, UA 0-5 0 - 5 WBC/hpf   Bacteria, UA RARE (A) NONE SEEN    Mucus PRESENT     Comment: Performed at [facility], [address], [place], [place] [address]  APTT     Status: None   Collection Time: 08/09/20  5:40 AM  Result Value Ref Range   aPTT 31 24 - 36 seconds    Comment: Performed at [facility], [address] [place], [place] [address]  CBC WITH DIFFERENTIAL     Status: None   Collection Time: 08/09/20  5:40 AM  Result Value Ref Range   WBC 6.9 4.0 - 10.5 K/uL   RBC 4.90 4.22 - 5.81 MIL/uL   Hemoglobin 15.0 13.0 - 17.0 g/dL   HCT 57.3 39 - 52 %   MCV 87.6 80.0 - 100.0 fL   MCH 30.6 26.0 - 34.0 pg   MCHC 35.0 30.0 - 36.0 g/dL   RDW 22.0 25.4 - 27.0 %   Platelets 256 150 - 400 K/uL   nRBC 0.0 0.0 - 0.2 %   Neutrophils Relative % 46 %   Neutro Abs 3.2 1.7 - 7.7 K/uL   Lymphocytes Relative 40 %   Lymphs Abs 2.7 0.7 - 4.0 K/uL   Monocytes Relative 9 %   Monocytes Absolute 0.6 0.1 - 1.0 K/uL   Eosinophils Relative 4 %   Eosinophils Absolute 0.3 0.0 - 0.5 K/uL   Basophils Relative 1 %  Basophils Absolute 0.1 0.0 - 0.1 K/uL   Immature Granulocytes 0 %   Abs Immature Granulocytes 0.02 0.00 - 0.07 K/uL    Comment: Performed at [facility], [address], [place], [place] [address]  Comprehensive metabolic panel     Status: Abnormal   Collection Time: 08/09/20  5:40 AM  Result Value Ref Range   Sodium 142 135 - 145 mmol/L   Potassium 4.1 3.5 - 5.1 mmol/L   Chloride 104 98 - 111 mmol/L   CO2 26 22 - 32 mmol/L   Glucose, Bld 101 (H) 70 - 99 mg/dL    Comment: Glucose reference range applies only to samples taken after fasting for at least 8 hours.   BUN 19 6 - 20 mg/dL   Creatinine, Ser 7.07 0.61 - 1.24 mg/dL   Calcium 9.8 8.9 - 86.7 mg/dL   Total Protein 7.2 6.5 - 8.1 g/dL   Albumin 4.4 3.5 - 5.0 g/dL   AST 29 15 - 41 U/L   ALT 47 (H) 0 - 44 U/L   Alkaline Phosphatase 47 38 - 126 U/L   Total Bilirubin 0.7 0.3 - 1.2 mg/dL   GFR, Estimated >54 >49 mL/min    Comment:  (NOTE) Calculated using the CKD-EPI Creatinine Equation (2021)    Anion gap 12 5 - 15    Comment: Performed at [facility], [address], [place], [place] [address]  Protime-INR     Status: None   Collection Time: 08/09/20  5:40 AM  Result Value Ref Range   Prothrombin Time 12.5 11.4 - 15.2 seconds   INR 1.0 0.8 - 1.2    Comment: (NOTE) INR goal varies based on device and disease states. Performed at [facility], [address], [place], [place] [address]   Type and screen Order type and screen if day of surgery is less than 15 days from draw of preadmission visit or order morning of surgery if day of surgery is greater than 6 days from preadmission visit.     Status: None   Collection Time: 08/09/20  5:40 AM  Result Value Ref Range   ABO/RH(D) O POS    Antibody Screen NEG    Sample Expiration      08/12/2020,2359 Performed at [facility], [address], [place], [place] [address]   ABO/Rh     Status: None   Collection Time: 08/09/20  6:30 AM  Result Value Ref Range   ABO/RH(D)      O POS Performed at [facility] [address] [place], [place] [address]    No results found.  Review of Systems  All other systems reviewed and are negative.   Blood pressure (!) 175/112, pulse 62, temperature 98.5 F (36.9 C), temperature source Oral, resp. rate 16, SpO2 100 %. Physical Exam Constitutional:      Appearance: He is well-developed.  HENT:     Head: Atraumatic.  Pulmonary:     Effort: Pulmonary effort is normal.  Musculoskeletal:     Comments: R shoulder limited ROM with weakness. NVID.  Skin:    General: Skin is warm and dry.  Neurological:     Mental Status: He is alert and oriented to person, place, and time.      Assessment/Plan  44 year old male status post multiple surgeries on the right shoulder with failed right total shoulder arthroplasty.  Indicated for revision to reverse  shoulder arthroplasty to try and salvage  function and decrease pain. Risks / benefits of surgery discussed Consent on chart  NPO for OR Preop antibiotics   Berline Lopes, MD 08/09/2020, 7:13 AM

## 2020-08-09 NOTE — Discharge Instructions (Signed)

## 2020-08-09 NOTE — Anesthesia Postprocedure Evaluation (Signed)
Anesthesia Post Note  Patient: Darren Caldwell  Procedure(s) Performed: REVISION TOTAL SHOULDER TO REVERSE TOTAL SHOULDER (Right Shoulder)     Patient location during evaluation: PACU Anesthesia Type: General Level of consciousness: awake and alert Pain management: pain level controlled Vital Signs Assessment: post-procedure vital signs reviewed and stable Respiratory status: spontaneous breathing, nonlabored ventilation, respiratory function stable and patient connected to nasal cannula oxygen Cardiovascular status: blood pressure returned to baseline and stable Postop Assessment: no apparent nausea or vomiting Anesthetic complications: no   No complications documented.  Last Vitals:  Vitals:   08/09/20 1145 08/09/20 1200  BP: 130/73 127/71  Pulse: 77 82  Resp: 10 (!) 21  Temp:    SpO2: 99% 95%    Last Pain:  Vitals:   08/09/20 1200  TempSrc:   PainSc: 0-No pain                 Shelton Silvas

## 2020-08-09 NOTE — Op Note (Signed)
Procedure(s): REVISION TOTAL SHOULDER TO REVERSE TOTAL SHOULDER Procedure Note  Darren Caldwell male 44 y.o. 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 male status post multiple right shoulder surgeries with infected hemiarthroplasty, subsequent total shoulder with subscapularis failure and failure of secondary surgery to try and repair the subscapularis.  He had chronic instability pain and dysfunction and all other options failed.  Indicated for surgical treatment to try and restore function and decrease pain.      Surgeon: Berline Lopes   Assistants: Damita Lack PA-C [facility] was present and scrubbed throughout the procedure and was essential in positioning, retraction, exposure, and closure)  Anesthesia: General endotracheal anesthesia with preoperative interscalene block given by the attending anesthesiologist    Procedure Detail  REVISION TOTAL SHOULDER TO REVERSE TOTAL SHOULDER   Estimated Blood Loss:  200 mL         Drains: none  Blood Given: none          Specimens: none        Complications:  * No complications entered in OR log *         Disposition: PACU - hemodynamically stable.         Condition: stable      OPERATIVE FINDINGS:  An attempt was made at preserving the stem, but after I was unable to reduce the joint with the onlay implant the decision was made to remove all implants including the stem and transition to a DJ O reverse shoulder arthroplasty.  Final stem was placed with a size 12 stem, 36 standard glenoid and standard polycomponent with good stability.    PROCEDURE: The patient was identified in the preoperative holding area  where I personally marked the operative site after verifying site, side,  and procedure with the patient. An  interscalene block given by  the attending anesthesiologist in the holding area and the patient was taken back to the operating room where all extremities were  carefully padded in position after general anesthesia was induced. She  was placed in a beach-chair position and the operative upper extremity was  prepped and draped in a standard sterile fashion.   The previous incision was opened sharply.  Dissection was carried down to subcutaneous tissues.  Cultures were taken in the subcutaneous layer.  The previous deltopectoral interval was carefully dissected taking the deltoid laterally.  The underlying conjoined tendon was carefully freed from scar tissue and identified.  Retractors were placed medial and lateral.  There was no significant identifiable subscapularis. The supraspinatus and infraspinatus were largely intact.  At this point the joint was externally rotated and the medial capsule was freed up.  The humeral head removal instrument was used to remove the humeral head without difficulty.  The humerus was then retracted to expose the glenoid.  A curved osteotome was used to free up the glenoid component which actually came out rather easily.  The central peg stayed intact.  There was an instrument used to remove the central peg without difficulty.  At this point copious irrigation was used on the glenoid side.  Additional cultures were taken from the joint fluid and from the collar tissue.  At this point the baseplate from the set was impacted into the previous central hole and peripheral screws were drilled measured and filled.  The 42 glenosphere was impacted and screwed in place.  Humeral side was then again exposed and the trial implant was placed.  Despite extensive releases and multiple attempts I was unable to reduce this with the large onlay implant.  The glenoid was then again exposed and the 42 glenosphere was removed.  The 36 trial was placed.  Joint reduction was then again  attempted.  At this point even with a 36 trial the joint was not reducible.  I did not feel that additional releases were possible.  The decision was made at this point that the stem would have to come out and transition to an inlay type implant.   The stem was removed without significant difficulty and it appears that there may have been some micromotion here as well.  At this point the internal aspect of the humeral canal was extensively debrided with the curette and rondure removing any membrane layer.  Copious irrigation was used.   At this point the glenoid was again exposed.  Bone graft was used in the previously placed holes.  The drill guide from the DJ O system was used to create a bicortical path.  The tap was then used and the final implant was placed with excellent fixation in the bone bicortically.  The peripheral screws were then drilled measured and filled with the appropriate size locking screw.  The 36 standard  trial was placed.  The proximal humerus was then again exposed and the metaphyseal bone was prepared with the reamers.  Trial stem was placed with a standard polycomponent and reduced.  The reduction was not difficult and the joint tension was excellent.  There was no tendency towards dislocation.  At this point attention was turned back to the glenoid where the trial was removed and the final 36 standard implant was placed.  The humeral shaft was then again exposed and using some bone graft medially the final implant was placed press-fit.  The final polycomponent was placed.  The joint was reduced and had excellent soft tissue tension stability and smooth motion.  The joint was then copiously irrigated with pulse  lavage and the wound was then closed.   Skin was closed with 2-0 Vicryl in a deep dermal layer and 4-0  Monocryl for skin closure. Steri-Strips were applied. Sterile  dressings were then applied as well as a sling. The patient was allowed  to awaken from general  anesthesia, transferred to stretcher, and taken  to recovery room in stable condition.   POSTOPERATIVE PLAN: The patient will be observed in the recovery room and as long as he is hemodynamically stable and his pain is well controlled I think he could go home today with family.  We will send him home on doxycycline twice daily until cultures from surgery return.  There is no clinical concern for infection at the time of surgery.

## 2020-08-09 NOTE — Progress Notes (Signed)
PACU note:  Shoulder Explant parts sent to pathology lab via Elnita Maxwell, RN. Explant release paper given to Marchelle Folks, RN (phase 2) for wife to sign and place in chart.  Wife will be instructed to pick up explant parts from pathology lab in a couple days. Dr. Ave Filter would like parts to be brought to office next time patient has appointment.   Will pass information along to [place], Charity fundraiser.  Lupe Carney, RN

## 2020-08-09 NOTE — Transfer of Care (Signed)
Immediate Anesthesia Transfer of Care Note  Patient: Darren Caldwell  Procedure(s) Performed: REVISION TOTAL SHOULDER TO REVERSE TOTAL SHOULDER (Right Shoulder)  Patient Location: PACU  Anesthesia Type:General  Level of Consciousness: awake, alert  and patient cooperative  Airway & Oxygen Therapy: Patient Spontanous Breathing and Patient connected to face mask oxygen  Post-op Assessment: Report given to RN and Post -op Vital signs reviewed and stable  Post vital signs: Reviewed and stable  Last Vitals:  Vitals Value Taken Time  BP 137/95 08/09/20 1139  Temp    Pulse 84 08/09/20 1141  Resp 22 08/09/20 1141  SpO2 100 % 08/09/20 1141  Vitals shown include unvalidated device data.  Last Pain:  Vitals:   08/09/20 0538  TempSrc: Oral         Complications: No complications documented.

## 2020-08-09 NOTE — Anesthesia Procedure Notes (Signed)
Anesthesia Regional Block: Interscalene brachial plexus block   Pre-Anesthetic Checklist: ,, timeout performed, Correct Patient, Correct Site, Correct Laterality, Correct Procedure, Correct Position, site marked, Risks and benefits discussed,  Surgical consent,  Pre-op evaluation,  At surgeon's request and post-op pain management  Laterality: Right  Prep: chloraprep       Needles:  Injection technique: Single-shot  Needle Type: Echogenic Stimulator Needle     Needle Length: 9cm  Needle Gauge: 21     Additional Needles:   Procedures:,,,, ultrasound used (permanent image in chart),,,,  Narrative:  Start time: 08/09/2020 7:10 AM End time: 08/09/2020 7:15 AM Injection made incrementally with aspirations every 5 mL.  Performed by: Personally  Anesthesiologist: Shelton Silvas, MD  Additional Notes: Patient tolerated the procedure well. Local anesthetic introduced in an incremental fashion under minimal resistance after negative aspirations. No paresthesias were elicited. After completion of the procedure, no acute issues were identified and patient continued to be monitored by RN.    Abnormal anatomy. In plane and out of plane technique utilized.

## 2020-08-09 NOTE — Anesthesia Procedure Notes (Signed)
Procedure Name: Intubation Date/Time: 08/09/2020 7:32 AM Performed by: Eben Burow, CRNA Pre-anesthesia Checklist: Patient identified, Emergency Drugs available, Suction available, Patient being monitored and Timeout performed Patient Re-evaluated:Patient Re-evaluated prior to induction Oxygen Delivery Method: Circle system utilized Preoxygenation: Pre-oxygenation with 100% oxygen Induction Type: IV induction Ventilation: Mask ventilation without difficulty Laryngoscope Size: Mac and 4 Grade View: Grade I Tube type: Oral Tube size: 7.5 mm Number of attempts: 1 Airway Equipment and Method: Stylet Placement Confirmation: ETT inserted through vocal cords under direct vision,  positive ETCO2 and breath sounds checked- equal and bilateral Secured at: 23 cm Tube secured with: Tape Dental Injury: Teeth and Oropharynx as per pre-operative assessment

## 2020-08-09 NOTE — Anesthesia Preprocedure Evaluation (Addendum)
Anesthesia Evaluation  Patient identified by MRN, date of birth, ID band Patient awake    Reviewed: Allergy & Precautions, NPO status , Patient's Chart, lab work & pertinent test results  Airway Mallampati: I  TM Distance: >3 FB Neck ROM: Full    Dental  (+) Teeth Intact, Dental Advisory Given   Pulmonary neg pulmonary ROS,    breath sounds clear to auscultation       Cardiovascular negative cardio ROS   Rhythm:Regular Rate:Normal     Neuro/Psych negative neurological ROS  negative psych ROS   GI/Hepatic negative GI ROS, Neg liver ROS,   Endo/Other  negative endocrine ROS  Renal/GU negative Renal ROS     Musculoskeletal negative musculoskeletal ROS (+)   Abdominal Normal abdominal exam  (+)   Peds  Hematology negative hematology ROS (+)   Anesthesia Other Findings   Reproductive/Obstetrics                            Anesthesia Physical Anesthesia Plan  ASA: II  Anesthesia Plan: General   Post-op Pain Management: GA combined w/ Regional for post-op pain   Induction: Intravenous  PONV Risk Score and Plan: 3 and Ondansetron, Dexamethasone and Midazolam  Airway Management Planned: Oral ETT  Additional Equipment: None  Intra-op Plan:   Post-operative Plan: Extubation in OR  Informed Consent: I have reviewed the patients History and Physical, chart, labs and discussed the procedure including the risks, benefits and alternatives for the proposed anesthesia with the patient or authorized representative who has indicated his/her understanding and acceptance.     Dental advisory given  Plan Discussed with: CRNA  Anesthesia Plan Comments:        Anesthesia Quick Evaluation

## 2020-08-14 LAB — AEROBIC/ANAEROBIC CULTURE W GRAM STAIN (SURGICAL/DEEP WOUND)
Culture: NO GROWTH
Culture: NO GROWTH

## 2020-08-15 ENCOUNTER — Telehealth: Payer: Self-pay

## 2020-08-15 ENCOUNTER — Other Ambulatory Visit: Payer: Self-pay | Admitting: Internal Medicine

## 2020-08-15 DIAGNOSIS — M00811 Arthritis due to other bacteria, right shoulder: Secondary | ICD-10-CM

## 2020-08-15 DIAGNOSIS — M009 Pyogenic arthritis, unspecified: Secondary | ICD-10-CM

## 2020-08-15 LAB — AEROBIC/ANAEROBIC CULTURE W GRAM STAIN (SURGICAL/DEEP WOUND)

## 2020-08-15 NOTE — Addendum Note (Signed)
Addended by: Shirlyn Goltz on: 08/15/2020 04:16 PM   Modules accepted: Orders

## 2020-08-15 NOTE — Telephone Encounter (Addendum)
Verbal order received per Dr. Drue Second  Patient will also need first dose at short stay of ceftriaxone 2 g Q 24 hours x1 for loading dose at short stay  Then ceftriaxone 2grams every 24 hours for 6 weeks  Dr. Drue Second has already placed orders in epic for picc line.  Spoke with Victorino Dike to coordinate Picc placement appointment. Friday arrive at 8:45am for 9am appointment Patient to arrive at the main entrance and be directed to radiology.  Patient made aware of appointments.   Orders faxed to short stay and [facility].  Valarie Cones

## 2020-08-16 ENCOUNTER — Other Ambulatory Visit: Payer: Self-pay | Admitting: Internal Medicine

## 2020-08-16 ENCOUNTER — Other Ambulatory Visit (HOSPITAL_COMMUNITY): Payer: Self-pay | Admitting: *Deleted

## 2020-08-16 MED ORDER — AMOXICILLIN 500 MG PO CAPS: 500.0000 mg | ORAL_CAPSULE | Freq: Three times a day (TID) | ORAL | 0 refills | Status: DC

## 2020-08-16 NOTE — Progress Notes (Signed)
I was contacted by dr Ave Filter that Darren Caldwell had remote hx of shoulder prosthetic joint infection with p.acnes. he underwent REVISION TOTAL SHOULDER TO REVERSE TOTAL SHOULDER -on 10/28. His cultures have subsequently +FEW PROPIONIBACTERIUM ACNES. Dr Ave Filter asked for our assistance  For treatment. Patient is currently on doxycycline. Will change to amoxicillin until he gets established on iv therapy. He has upcoming appt on 11/8 with dr Victorio Palm B. Drue Second MD [facility] [phone]

## 2020-08-17 ENCOUNTER — Other Ambulatory Visit: Payer: Self-pay

## 2020-08-17 ENCOUNTER — Encounter (HOSPITAL_COMMUNITY)
Admission: RE | Admit: 2020-08-17 | Discharge: 2020-08-17 | Disposition: A | Discharge status: Home / Self Care | Payer: 59 | Source: Ambulatory Visit | Attending: Internal Medicine | Admitting: Internal Medicine

## 2020-08-17 ENCOUNTER — Ambulatory Visit (HOSPITAL_COMMUNITY)
Admission: RE | Admit: 2020-08-17 | Discharge: 2020-08-17 | Disposition: A | Discharge status: Home / Self Care | Payer: 59 | Source: Ambulatory Visit | Attending: Internal Medicine | Admitting: Internal Medicine

## 2020-08-17 ENCOUNTER — Telehealth: Payer: Self-pay | Admitting: *Deleted

## 2020-08-17 DIAGNOSIS — M009 Pyogenic arthritis, unspecified: Secondary | ICD-10-CM | POA: Insufficient documentation

## 2020-08-17 DIAGNOSIS — M00811 Arthritis due to other bacteria, right shoulder: Secondary | ICD-10-CM | POA: Insufficient documentation

## 2020-08-17 MED ORDER — HEPARIN SOD (PORK) LOCK FLUSH 100 UNIT/ML IV SOLN
INTRAVENOUS | Status: AC
  Administered 2020-08-17: 250 [IU]
  Filled 2020-08-17: qty 5

## 2020-08-17 MED ORDER — LIDOCAINE HCL 1 % IJ SOLN
INTRAMUSCULAR | Status: AC
  Filled 2020-08-17: qty 20

## 2020-08-17 MED ORDER — LIDOCAINE HCL (PF) 1 % IJ SOLN
INTRAMUSCULAR | Status: AC | PRN
  Administered 2020-08-17: 5 mL

## 2020-08-17 MED ORDER — SODIUM CHLORIDE 0.9 % IV SOLN
2.0000 g | Freq: Once | INTRAVENOUS | Status: AC
  Administered 2020-08-17: 2 g via INTRAVENOUS
  Filled 2020-08-17: qty 20

## 2020-08-17 NOTE — Telephone Encounter (Signed)
Patient left message in triage asking if he should continue the oral doxycycline, now that he has a PICC and has started the ceftriaxone. RN returned the call, left voicemail that he should stop the doxycycline and does not need to start the amoxicillin called in yesterday by Dr Drue Second, that the ceftriaxone is his treatment now. Andree Coss, RN

## 2020-08-20 ENCOUNTER — Other Ambulatory Visit: Payer: Self-pay

## 2020-08-20 ENCOUNTER — Telehealth: Payer: Self-pay

## 2020-08-20 ENCOUNTER — Encounter: Payer: Self-pay | Admitting: Infectious Disease

## 2020-08-20 ENCOUNTER — Ambulatory Visit: Payer: 59 | Admitting: Infectious Disease

## 2020-08-20 DIAGNOSIS — A498 Other bacterial infections of unspecified site: Secondary | ICD-10-CM | POA: Diagnosis not present

## 2020-08-20 DIAGNOSIS — T8459XA Infection and inflammatory reaction due to other internal joint prosthesis, initial encounter: Secondary | ICD-10-CM | POA: Diagnosis not present

## 2020-08-20 DIAGNOSIS — Z96619 Presence of unspecified artificial shoulder joint: Secondary | ICD-10-CM

## 2020-08-20 HISTORY — DX: Other bacterial infections of unspecified site: A49.8

## 2020-08-20 HISTORY — DX: Infection and inflammatory reaction due to other internal joint prosthesis, initial encounter: Z96.619

## 2020-08-20 HISTORY — DX: Infection and inflammatory reaction due to other internal joint prosthesis, initial encounter: T84.59XA

## 2020-08-20 MED ORDER — AMOXICILLIN 500 MG PO CAPS
500.0000 mg | ORAL_CAPSULE | Freq: Three times a day (TID) | ORAL | 11 refills | Status: DC
Start: 2020-08-20 — End: 2021-04-24

## 2020-08-20 NOTE — Progress Notes (Signed)
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   MRN: [id number]  HPI   44 yo with a complex R shoulder history. Per chart review and patient, approximately 6 years ago he underwent arthroscopic R shoulder RCR, SLAP repair and biceps tenodesis at an outside instition. This was then followed by an arthroscopic debridement several years later. He continued to have pain, he then underwent hemiarthroplasty of the right shoulder with Dr. Jacklyn Shell in 05/2018. His symptoms of pain and instability persisted and he eventually presented to Dr. Case in 2019 who performed explant, antibiotic spacer with cultures growing P.acnes. He then underwent TSA in 07/2019 with Dr. Case. Most recently in 12/2019 he experienced continued instability, weakness and pain and underwent open subscapularis repair.  He was evaluated by Dr. Ave Filter who him with the option for revision with reverse shoulder arthroplasty to try to salvage shoulder function.  Ave Filter took him to the operating room on August 09, 2020 and performed revision of his total shoulder to a reverse total shoulder.  The OR attempts were made to preserve the stem from prior shoulder but this was unsuccessful.  All the implants had to be removed.  Intraoperative cultures were taken new hardware placed.  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  Patient was on doxycycline.  Dr. Ave Filter  reached out to my partner Dr. Drue Second who recommended placement of PICC line and initiation of ceftriaxone which the patient has been on.  She had also recommended him switch to amoxicillin prior to PICC line placement but he simply gone from doxycycline to ceftriaxone.  Tolerating Ceftriaxone without trouble PICC line is clean and he has having no trouble with antibiotics.  I told him  that he is going to need chronic oral antibiotics for at least a half a year to a year to protect his new prosthetic shoulder and that he might even need longer-term antibiotics than that.  Past Medical History:  Diagnosis Date  . 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 BILATERAL HEMI SHOULDER ARTHROPLASTY Right   . HERNIA REPAIR    . SHOULDER ARTHROSCOPY    . SHOULDER ARTHROTOMY    . TONSILLECTOMY      No family history on file.    Social History   Socioeconomic History  . Marital status: Married    Spouse name: Not on file  . Number of children: Not on file  . Years of education: Not on file  . Highest education level: Not on file  Occupational History  . Not on file  Tobacco Use  . Smoking status: Never Smoker  . Smokeless tobacco: Never Used  Vaping Use  . Vaping Use: Never used  Substance and Sexual Activity  . Alcohol use: Yes    Comment: RARELLY  . Drug use: Never  . Sexual activity: Not on file  Other Topics Concern  . Not on file  Social History Narrative  . Not on file   Social Determinants of Health   Financial Resource Strain:   . Difficulty of Paying Living Expenses: Not on file  Food Insecurity:   . Worried About Programme researcher, broadcasting/film/video in the Last Year: Not on file  . Ran Out of Food in the  Last Year: Not on file  Transportation Needs:   . Lack of Transportation (Medical): Not on file  . Lack of Transportation (Non-Medical): Not on file  Physical Activity:   . Days of Exercise per Week: Not on file  . Minutes of Exercise per Session: Not on file  Stress:   . Feeling of Stress : Not on file  Social Connections:   . Frequency of Communication with Friends and Family: Not on file  . Frequency of Social Gatherings with Friends and Family: Not on file  . Attends Religious Services: Not on file  . Active Member of Clubs or  Organizations: Not on file  . Attends Banker Meetings: Not on file  . Marital Status: Not on file    No Known Allergies   Current Outpatient Medications:  .  acetaminophen (TYLENOL) 500 MG tablet, Take 1,000 mg by mouth every 6 (six) hours as needed for moderate pain or headache., Disp: , Rfl:  .  amoxicillin (AMOXIL) 500 MG capsule, Take 1 capsule (500 mg total) by mouth 3 (three) times daily. To start when IV abx stop, Disp: 90 capsule, Rfl: 11 .  Multiple Vitamin (MULTIVITAMIN WITH MINERALS) TABS tablet, Take 1 tablet by mouth 2 (two) times a week., Disp: , Rfl:  .  oxyCODONE-acetaminophen (PERCOCET) 5-325 MG tablet, Take 1-2 tablets every 4 hours as needed for post operative pain. MAX 6/day, Disp: 30 tablet, Rfl: 0 .  tiZANidine (ZANAFLEX) 4 MG tablet, Take 1 tablet (4 mg total) by mouth every 8 (eight) hours as needed for muscle spasms., Disp: 30 tablet, Rfl: 1   No past medical history on file.  Past Surgical History:  Procedure Laterality Date  . BILATERAL HEMI SHOULDER ARTHROPLASTY Right   . HERNIA REPAIR    . SHOULDER ARTHROSCOPY    . SHOULDER ARTHROTOMY    . TONSILLECTOMY      No family history on file.    Social History   Socioeconomic History  . Marital status: Married    Spouse name: Not on file  . Number of children: Not on file  . Years of education: Not on file  . Highest education level: Not on file  Occupational History  . Not on file  Tobacco Use  . Smoking status: Never Smoker  . Smokeless tobacco: Never Used  Vaping Use  . Vaping Use: Never used  Substance and Sexual Activity  . Alcohol use: Yes    Comment: RARELLY  . Drug use: Never  . Sexual activity: Not on file  Other Topics Concern  . Not on file  Social History Narrative  . Not on file   Social Determinants of Health   Financial Resource Strain:   . Difficulty of Paying Living Expenses: Not on file  Food Insecurity:   . Worried About Programme researcher, broadcasting/film/video in the Last  Year: Not on file  . Ran Out of Food in the Last Year: Not on file  Transportation Needs:   . Lack of Transportation (Medical): Not on file  . Lack of Transportation (Non-Medical): Not on file  Physical Activity:   . Days of Exercise per Week: Not on file  . Minutes of Exercise per Session: Not on file  Stress:   . Feeling of Stress : Not on file  Social Connections:   . Frequency of Communication with Friends and Family: Not on file  . Frequency of Social Gatherings with Friends and Family: Not on file  . Attends Religious  Services: Not on file  . Active Member of Clubs or Organizations: Not on file  . Attends BankerClub or Organization Meetings: Not on file  . Marital Status: Not on file    No Known Allergies   Current Outpatient Medications:  .  acetaminophen (TYLENOL) 500 MG tablet, Take 1,000 mg by mouth every 6 (six) hours as needed for moderate pain or headache., Disp: , Rfl:  .  amoxicillin (AMOXIL) 500 MG capsule, Take 1 capsule (500 mg total) by mouth 3 (three) times daily., Disp: 30 capsule, Rfl: 0 .  Multiple Vitamin (MULTIVITAMIN WITH MINERALS) TABS tablet, Take 1 tablet by mouth 2 (two) times a week., Disp: , Rfl:  .  oxyCODONE-acetaminophen (PERCOCET) 5-325 MG tablet, Take 1-2 tablets every 4 hours as needed for post operative pain. MAX 6/day, Disp: 30 tablet, Rfl: 0 .  tiZANidine (ZANAFLEX) 4 MG tablet, Take 1 tablet (4 mg total) by mouth every 8 (eight) hours as needed for muscle spasms., Disp: 30 tablet, Rfl: 1   Review of Systems  Constitutional: Negative for chills and fever.  HENT: Negative for congestion and sore throat.   Eyes: Negative for photophobia.  Respiratory: Negative for cough, shortness of breath and wheezing.   Cardiovascular: Negative for chest pain, palpitations and leg swelling.  Gastrointestinal: Negative for abdominal pain, blood in stool, constipation, diarrhea, nausea and vomiting.  Genitourinary: Negative for dysuria, flank pain and hematuria.   Musculoskeletal: Negative for back pain and myalgias.  Skin: Positive for wound. Negative for rash.  Neurological: Negative for dizziness, weakness and headaches.  Hematological: Does not bruise/bleed easily.  Psychiatric/Behavioral: Negative for suicidal ideas.       Objective:   Physical Exam Constitutional:      General: He is not in acute distress.    Appearance: Normal appearance. He is well-developed. He is not ill-appearing or diaphoretic.  HENT:     Head: Normocephalic and atraumatic.     Right Ear: Hearing and external ear normal.     Left Ear: Hearing and external ear normal.     Nose: No nasal deformity or rhinorrhea.  Eyes:     General: No scleral icterus.    Conjunctiva/sclera: Conjunctivae normal.     Right eye: Right conjunctiva is not injected.     Left eye: Left conjunctiva is not injected.     Pupils: Pupils are equal, round, and reactive to light.  Neck:     Vascular: No JVD.  Cardiovascular:     Rate and Rhythm: Normal rate and regular rhythm.     Heart sounds: S1 normal and S2 normal.  Pulmonary:     Effort: Pulmonary effort is normal. No respiratory distress.     Breath sounds: No wheezing.  Abdominal:     General: There is no distension.     Palpations: Abdomen is soft.  Musculoskeletal:        General: Normal range of motion.     Right shoulder: Normal.     Left shoulder: Normal.     Cervical back: Normal range of motion and neck supple.     Right hip: Normal.     Left hip: Normal.     Right knee: Normal.     Left knee: Normal.  Lymphadenopathy:     Head:     Right side of head: No submandibular, preauricular or posterior auricular adenopathy.     Left side of head: No submandibular, preauricular or posterior auricular adenopathy.     Cervical: No cervical  adenopathy.     Right cervical: No superficial or deep cervical adenopathy.    Left cervical: No superficial or deep cervical adenopathy.  Skin:    General: Skin is warm and dry.      Coloration: Skin is not pale.     Findings: No abrasion, bruising, ecchymosis, erythema, lesion or rash.     Nails: There is no clubbing.  Neurological:     General: No focal deficit present.     Mental Status: He is alert and oriented to person, place, and time.     Sensory: No sensory deficit.     Coordination: Coordination normal.     Gait: Gait normal.  Psychiatric:        Attention and Perception: He is attentive.        Mood and Affect: Mood normal.        Speech: Speech normal.        Behavior: Behavior normal. Behavior is cooperative.        Thought Content: Thought content normal.        Judgment: Judgment normal.     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  Have him come back to clinic in 2 months time to reassess his progress and he should continue to follow obviously with Dr. Ave Filter.  Tattoos: he has screened negative for HCV at [facility]

## 2020-08-20 NOTE — Telephone Encounter (Signed)
Contacted [facility] to give verbal orders to pull PICC upon completion of IV antibiotics. Prescription for oral abx sent to patient's preferred pharmacy. Patient notified.   Tyberius Ryner Loyola Mast, RN

## 2020-08-21 ENCOUNTER — Encounter: Payer: Self-pay | Admitting: Family

## 2020-08-27 ENCOUNTER — Other Ambulatory Visit: Payer: Self-pay

## 2020-08-27 ENCOUNTER — Telehealth: Payer: Self-pay

## 2020-08-27 DIAGNOSIS — M00811 Arthritis due to other bacteria, right shoulder: Secondary | ICD-10-CM

## 2020-08-27 DIAGNOSIS — T8459XA Infection and inflammatory reaction due to other internal joint prosthesis, initial encounter: Secondary | ICD-10-CM

## 2020-08-27 DIAGNOSIS — Z96619 Presence of unspecified artificial shoulder joint: Secondary | ICD-10-CM

## 2020-08-27 NOTE — Telephone Encounter (Signed)
Received call in triage from [facility] PharmD with high potassium level of 5.9. No previous Potassium levels noted. Routing to MD for advise. Valarie Cones

## 2020-08-27 NOTE — Telephone Encounter (Signed)
Patient states he is feeling fine, denies any fatigue, weakness, cramps. Patient agrees to come by the office tomorrow for repeat BMP

## 2020-08-27 NOTE — Telephone Encounter (Signed)
Does he have any symptoms? I suspect the sample is hemolyzed. I would recommend he get  repeat BMP either here or PCP or urgent care tomorroow (he can go tonight as well but it is already 4pm

## 2020-08-28 ENCOUNTER — Other Ambulatory Visit: Payer: 59

## 2020-08-28 ENCOUNTER — Encounter (HOSPITAL_COMMUNITY): Payer: Self-pay | Admitting: Orthopedic Surgery

## 2020-08-28 ENCOUNTER — Other Ambulatory Visit: Payer: Self-pay

## 2020-08-28 ENCOUNTER — Telehealth: Payer: Self-pay | Admitting: *Deleted

## 2020-08-28 DIAGNOSIS — M00811 Arthritis due to other bacteria, right shoulder: Secondary | ICD-10-CM

## 2020-08-28 NOTE — Telephone Encounter (Addendum)
Patient here for repeat bloodwork, states he had labs drawn from PICC 11/15 (yesterday) by home health.  RN confirmed this with Corrie Dandy at [facility].   Spoke with patient in lab, apologized for the confusion. He was fine, actually glad to have a nurse look at his arm. There is swelling at the surgical incision. He has called the ortho office, spoke with PA who said this could be expected, advised ice 5 times daily and said they wouldn't aspirate at this point due to his infection.  RN encouraged him to follow ortho advice and to keep track of swelling, notifing the ortho office if it gets worse. Area of swelling is approximately 15 cm circle, no redness. Area of swelling is spongy at top, harder at bottom. He will follow with Ortho. Andree Coss, RN

## 2020-08-28 NOTE — Progress Notes (Signed)
Repeat BMP verbal order per Dr. Daiva Eves [facility]

## 2020-08-28 NOTE — Addendum Note (Signed)
Addended by: Valarie Cones on: 08/28/2020 10:30 AM   Modules accepted: Orders

## 2020-08-28 NOTE — Telephone Encounter (Signed)
Thanks Michelle

## 2020-09-10 ENCOUNTER — Encounter: Payer: Self-pay | Admitting: Family

## 2020-09-13 ENCOUNTER — Telehealth: Payer: Self-pay

## 2020-09-13 NOTE — Telephone Encounter (Signed)
[name], Pharmacist with [facility] is calling regarding elevated potasium of 6.7 on labs drawn on 09-12-2020.  [name] has requested the nurse redraw the labs since they were placed in a lab in a box container that sometimes causes distortions in labs based on the temperature.  He will call our office with repeated results once received.   Laurell Josephs, RN

## 2020-09-13 NOTE — Telephone Encounter (Signed)
They are repeating today correct? Can they run it stat and get it back to [place] quickly?

## 2020-09-17 ENCOUNTER — Encounter: Payer: Self-pay | Admitting: Infectious Disease

## 2020-09-19 ENCOUNTER — Encounter (HOSPITAL_COMMUNITY): Payer: Self-pay

## 2020-09-19 ENCOUNTER — Other Ambulatory Visit: Payer: Self-pay

## 2020-09-19 ENCOUNTER — Telehealth: Payer: Self-pay

## 2020-09-19 ENCOUNTER — Ambulatory Visit (HOSPITAL_COMMUNITY)
Admission: EM | Admit: 2020-09-19 | Discharge: 2020-09-19 | Disposition: A | Discharge status: Home / Self Care | Payer: 59 | Attending: Family Medicine | Admitting: Family Medicine

## 2020-09-19 DIAGNOSIS — E878 Other disorders of electrolyte and fluid balance, not elsewhere classified: Secondary | ICD-10-CM | POA: Diagnosis present

## 2020-09-19 LAB — POTASSIUM: Potassium: 3.7 mmol/L (ref 3.5–5.1)

## 2020-09-19 NOTE — ED Triage Notes (Signed)
Pt presents with need for potassium level to be redrawn. Pt was told his potassium was elevated today and he needed a redraw tonight.

## 2020-09-19 NOTE — ED Provider Notes (Signed)
[facility]    CSN: [id number] Arrival date & time: 09/19/20  1836      History   Chief Complaint Chief Complaint  Patient presents with  . Lab Draw    HPI Darren Caldwell is a 44 y.o. male.   Here today requesting re-check of his potassium level. States he gets blood drawn weekly as he has a picc line in for abx related to a shoulder surgery complication and 2 days ago had a potassium reading of 6.7. It was rechecked yesterday and came back at 6.1. He is asymptomatic, and not on any medications other than a multivitamin and his abx. Denies dietary changes.      Past Medical History:  Diagnosis Date  . 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 Presence of right artificial shoulder joint 12/02/2019  . Chronic pain syndrome 09/28/2019  . 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 History of shoulder surgery 12/01/2017  . Pain in joint of right shoulder 12/01/2017  . Perforation of ear drum, left 04/09/2017  . Insomnia 06/24/2013  . Shoulder pain 06/24/2013    Past Surgical History:  Procedure Laterality Date  . BILATERAL HEMI SHOULDER ARTHROPLASTY Right   . HERNIA REPAIR    . REVISION TOTAL SHOULDER TO REVERSE TOTAL SHOULDER Right 08/09/2020   Procedure: REVISION TOTAL SHOULDER TO REVERSE TOTAL SHOULDER;  Surgeon: Jones Broom, MD;  Location: [facility];  Service: Orthopedics;  Laterality: Right;  . SHOULDER ARTHROSCOPY    . SHOULDER ARTHROTOMY    . TONSILLECTOMY         Home Medications    Prior to Admission medications   Medication Sig Start Date End Date Taking? Authorizing Provider  cefTRIAXone (ROCEPHIN) IVPB Inject into the vein.   Yes [provider]  acetaminophen (TYLENOL) 500 MG tablet Take 1,000 mg  by mouth every 6 (six) hours as needed for moderate pain or headache.    [provider]  amoxicillin (AMOXIL) 500 MG capsule Take 1 capsule (500 mg total) by mouth 3 (three) times daily. To start when IV abx stop 08/20/20   Daiva Eves, Lisette Grinder, MD  Multiple Vitamin (MULTIVITAMIN WITH MINERALS) TABS tablet Take 1 tablet by mouth 2 (two) times a week.    [provider]  oxyCODONE-acetaminophen (PERCOCET) 5-325 MG tablet Take 1-2 tablets every 4 hours as needed for post operative pain. MAX 6/day 08/09/20   Jiles Harold, PA-C  tiZANidine (ZANAFLEX) 4 MG tablet Take 1 tablet (4 mg total) by mouth every 8 (eight) hours as needed for muscle spasms. 08/09/20   Jiles Harold, PA-C    Family History Family History  Problem Relation Age of Onset  . Healthy Mother   . Healthy Father     Social History Social History   Tobacco Use  . Smoking status: Never Smoker  . Smokeless tobacco: Never Used  Vaping Use  . Vaping Use: Never used  Substance Use Topics  . Alcohol use: Yes    Comment: RARELLY  . Drug use: Never     Allergies   Patient has no known allergies.   Review of Systems Review of Systems PER HPI    Physical Exam Triage Vital Signs ED Triage Vitals  Enc Vitals Group     BP 09/19/20 1903 (!) 145/96  Pulse Rate 09/19/20 1903 72     Resp 09/19/20 1903 19     Temp 09/19/20 1903 98.4 F (36.9 C)     Temp src --      SpO2 09/19/20 1903 100 %     Weight --      Height --      Head Circumference --      Peak Flow --      Pain Score 09/19/20 1902 0     Pain Loc --      Pain Edu? --      Excl. in GC? --    No data found.  Updated Vital Signs BP (!) 145/96   Pulse 72   Temp 98.4 F (36.9 C)   Resp 19   SpO2 100%   Visual Acuity Right Eye Distance:   Left Eye Distance:   Bilateral Distance:    Right Eye Near:   Left Eye Near:    Bilateral Near:     Physical Exam Vitals and nursing note reviewed.  Constitutional:       Appearance: Normal appearance.  HENT:     Head: Atraumatic.  Eyes:     Extraocular Movements: Extraocular movements intact.     Conjunctiva/sclera: Conjunctivae normal.  Cardiovascular:     Rate and Rhythm: Normal rate and regular rhythm.  Pulmonary:     Effort: Pulmonary effort is normal.     Breath sounds: Normal breath sounds.  Musculoskeletal:        General: Normal range of motion.     Cervical back: Normal range of motion and neck supple.  Skin:    General: Skin is warm and dry.  Neurological:     General: No focal deficit present.     Mental Status: He is oriented to person, place, and time.  Psychiatric:        Mood and Affect: Mood normal.        Thought Content: Thought content normal.        Judgment: Judgment normal.      UC Treatments / Results  Labs (all labs ordered are listed, but only abnormal results are displayed) Labs Reviewed  POTASSIUM    EKG   Radiology No results found.  Procedures Procedures (including critical care time)  Medications Ordered in UC Medications - No data to display  Initial Impression / Assessment and Plan / UC Course  I have reviewed the triage vital signs and the nursing notes.  Pertinent labs & imaging results that were available during my care of the patient were reviewed by me and considered in my medical decision making (see chart for details).     Potassium improved to 3.7 today, likely hemolyzed specimens previously. Continue present regimen, reassurance given.   Final Clinical Impressions(s) / UC Diagnoses   Final diagnoses:  Electrolyte abnormality   Discharge Instructions   None    ED Prescriptions    None     PDMP not reviewed this encounter.   Particia Nearing, [place] 09/19/20 2002

## 2020-09-19 NOTE — Telephone Encounter (Signed)
Dr. Renold Don has spoke with [name] with [facility] and per Dr. Renold Don patient needs to go to UC to have a repeat K+ tonight. [name] stated he will call the patient and advise him to go to UC to have K+ repeated tonight. Angelyna Henderson T Pricilla Loveless

## 2020-09-19 NOTE — Telephone Encounter (Signed)
[name] pharmacist with [facility] called stated the patient's potassium is at a 6.1. They will do repeat labs to check K+ tomorrow per [name].  Darren Caldwell

## 2020-09-20 ENCOUNTER — Other Ambulatory Visit: Payer: Self-pay

## 2020-09-20 DIAGNOSIS — Z96619 Presence of unspecified artificial shoulder joint: Secondary | ICD-10-CM

## 2020-09-20 DIAGNOSIS — T8459XD Infection and inflammatory reaction due to other internal joint prosthesis, subsequent encounter: Secondary | ICD-10-CM

## 2020-09-20 NOTE — Telephone Encounter (Addendum)
[name] was able to get in contact with the patient yesterday and instruct patient to go to ED or UC to have labs repeated. Patient went to the ED to have labs drawn and his potassium was 3.1. Would it be ok to for patient to go to [facility] or come to our facility until picc is removed to have labs drawn? Per [name] he believes patient's previous specimens drawn from picc were hemolyzed. Please advise Rynn Markiewicz T Pricilla Loveless

## 2020-09-20 NOTE — Telephone Encounter (Signed)
Patient scheduled for follow up labs as a nurse visit on 12/13 at our office to avoid discrepancies due to temperature changes. Will notify [facility] of change. Please let me know which labs you'd like drawn and I'll place orders.   Thanks Dr. Daiva Eves! Rosanna Randy, RN

## 2020-09-20 NOTE — Telephone Encounter (Signed)
Can we get a CBC with differential, BMP and ESR and a CRP

## 2020-09-24 ENCOUNTER — Ambulatory Visit: Payer: 59 | Admitting: *Deleted

## 2020-09-24 ENCOUNTER — Other Ambulatory Visit: Payer: Self-pay

## 2020-09-24 DIAGNOSIS — T8459XD Infection and inflammatory reaction due to other internal joint prosthesis, subsequent encounter: Secondary | ICD-10-CM

## 2020-09-24 NOTE — Progress Notes (Signed)
Patient here for lab draw via PICC. His home health nurse will change his PICC dressing this afternoon.  Patient at [facility] for labs to see if his potassium will be normal if labs are processed immediately, rather than placed in pickup box. PICC patent with good flush and draw back. RN sent mychart sign up link to his cell phone per his request. Andree Coss, RN

## 2020-09-25 LAB — BASIC METABOLIC PANEL
BUN: 20 mg/dL (ref 7–25)
CO2: 27 mmol/L (ref 20–32)
Calcium: 9.5 mg/dL (ref 8.6–10.3)
Chloride: 105 mmol/L (ref 98–110)
Creat: 1.03 mg/dL (ref 0.60–1.35)
Glucose, Bld: 86 mg/dL (ref 65–99)
Potassium: 4.2 mmol/L (ref 3.5–5.3)
Sodium: 140 mmol/L (ref 135–146)

## 2020-09-25 LAB — CBC WITH DIFFERENTIAL/PLATELET
Absolute Monocytes: 514 cells/uL (ref 200–950)
Basophils Absolute: 48 cells/uL (ref 0–200)
Basophils Relative: 1 %
Eosinophils Absolute: 202 cells/uL (ref 15–500)
Eosinophils Relative: 4.2 %
HCT: 39.6 % (ref 38.5–50.0)
Hemoglobin: 13.7 g/dL (ref 13.2–17.1)
Lymphs Abs: 1387 cells/uL (ref 850–3900)
MCH: 29.5 pg (ref 27.0–33.0)
MCHC: 34.6 g/dL (ref 32.0–36.0)
MCV: 85.3 fL (ref 80.0–100.0)
MPV: 9.2 fL (ref 7.5–12.5)
Monocytes Relative: 10.7 %
Neutro Abs: 2650 cells/uL (ref 1500–7800)
Neutrophils Relative %: 55.2 %
Platelets: 247 10*3/uL (ref 140–400)
RBC: 4.64 10*6/uL (ref 4.20–5.80)
RDW: 12.4 % (ref 11.0–15.0)
Total Lymphocyte: 28.9 %
WBC: 4.8 10*3/uL (ref 3.8–10.8)

## 2020-09-25 LAB — C-REACTIVE PROTEIN: CRP: 1.7 mg/L (ref <8.0)

## 2020-09-25 LAB — SEDIMENTATION RATE: Sed Rate: 2 mm/h (ref 0–15)

## 2020-09-27 ENCOUNTER — Telehealth: Payer: Self-pay

## 2020-09-27 NOTE — Telephone Encounter (Signed)
If he is doing well his PICC line can come out but he needs to start the oral amoxicillin that I prescribed 500 mg 3 times daily as soon as IV antibiotics have been stopped

## 2020-09-27 NOTE — Telephone Encounter (Signed)
Received call from patient requesting pull picc information and if he needs to come back to [facility] for blood draw prior to pulling picc line. Patient states he is suppose to end on 09/29/20. Routing to Dr. Daiva Eves for advise.  Valarie Cones

## 2020-09-27 NOTE — Telephone Encounter (Signed)
Thanks so much. 

## 2020-09-27 NOTE — Telephone Encounter (Signed)
Call placed to [facility] spoke with Corrie Dandy and gave verbal orders to remove picc line after completion of therapy per Dr. Daiva Eves. Verbal orders read back and understood.   Call also placed to patient, had to leave a voicemail as patient did not answer. Will continue to follow.  Valarie Cones

## 2020-10-02 NOTE — Telephone Encounter (Signed)
Followed up with patient, picc line was removed and patient started oral amoxicillin same day 09/29/20.

## 2020-10-15 ENCOUNTER — Ambulatory Visit (INDEPENDENT_AMBULATORY_CARE_PROVIDER_SITE_OTHER): Payer: 59 | Admitting: Infectious Disease

## 2020-10-15 ENCOUNTER — Other Ambulatory Visit: Payer: Self-pay

## 2020-10-15 ENCOUNTER — Encounter: Payer: Self-pay | Admitting: Infectious Disease

## 2020-10-15 VITALS — BP 139/86 | HR 56 | Temp 97.4°F | Ht 72.0 in | Wt 253.0 lb

## 2020-10-15 DIAGNOSIS — A498 Other bacterial infections of unspecified site: Secondary | ICD-10-CM

## 2020-10-15 DIAGNOSIS — B9689 Other specified bacterial agents as the cause of diseases classified elsewhere: Secondary | ICD-10-CM | POA: Diagnosis not present

## 2020-10-15 DIAGNOSIS — T8459XD Infection and inflammatory reaction due to other internal joint prosthesis, subsequent encounter: Secondary | ICD-10-CM

## 2020-10-15 DIAGNOSIS — Z96611 Presence of right artificial shoulder joint: Secondary | ICD-10-CM

## 2020-10-15 DIAGNOSIS — Z9889 Other specified postprocedural states: Secondary | ICD-10-CM

## 2020-10-15 NOTE — Progress Notes (Signed)
Chief complaint: Follow-up for prosthetic shoulder infection with some residual pain when he does physical therapy year which is his shoulder in a particular position    Subjective:    Patient ID: Darren Caldwell, male    DOB: 1976/02/23, 45 y.o.   MRN: [id number]  HPI   45 yo with a complex R shoulder history. Per chart review and patient, approximately 6 years ago he underwent arthroscopic R shoulder RCR, SLAP repair and biceps tenodesis at an outside instition. This was then followed by an arthroscopic debridement several years later. He continued to have pain, he then underwent hemiarthroplasty of the right shoulder with Dr. Jacklyn Shell in 05/2018. His symptoms of pain and instability persisted and he eventually presented to Dr. Case in 2019 who performed explant, antibiotic spacer with cultures growing P.acnes. He then underwent TSA in 07/2019 with Dr. Case. Most recently in 12/2019 he experienced continued instability, weakness and pain and underwent open subscapularis repair.  He was evaluated by Dr. Ave Filter who him with the option for revision with reverse shoulder arthroplasty to try to salvage shoulder function.  Dr. Ave Filter took him to the operating room on August 09, 2020 and performed revision of his total shoulder to a reverse total shoulder.  The OR attempts were made to preserve the stem from prior shoulder but this was unsuccessful.  All the implants had to be removed.  Intraoperative cultures were taken new hardware placed.  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  Patient was on doxycycline.  Dr. Ave Filter  reached out to my partner Dr. Drue Second who recommended placement of PICC line and initiation of ceftriaxone which the patient has been on.  She had also recommended him switch to amoxicillin prior to PICC line placement but he simply gone from doxycycline to ceftriaxone.  Tolerated Ceftriaxone without trouble  I told him at prior  visit he was going to need chronic oral antibiotics for at least a half a year to a year to protect his new prosthetic shoulder and that he might even need longer-term antibiotics than that.  The last saw him he had some problems with pseudohyperkalemia due to hemolysis of blood taken by home health company.  Labs were normal here.  His inflammatory markers remain normal here.  PICC line is out he has been on amoxicillin.  He is participating in physical therapy with Dr. Veda Canning practice.  He is making great progress with regards to range of motion but still has some pain in particular positions he has no pain at rest whatsoever.    Past Medical History:  Diagnosis Date  . 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 BILATERAL HEMI SHOULDER ARTHROPLASTY Right   . HERNIA REPAIR    . REVISION TOTAL SHOULDER TO REVERSE TOTAL SHOULDER Right 08/09/2020   Procedure: REVISION TOTAL SHOULDER TO REVERSE TOTAL SHOULDER;  Surgeon: Jones Broom, MD;  Location: [facility];  Service: Orthopedics;  Laterality: Right;  . SHOULDER ARTHROSCOPY    . SHOULDER ARTHROTOMY    . TONSILLECTOMY      Family History  Problem Relation Age of Onset  . Healthy Mother   . Healthy Father       Social History   Socioeconomic History  . Marital status: Married    Spouse name: Not on file  . Number of children: Not on file  . Years of education: Not  on file  . Highest education level: Not on file  Occupational History  . Not on file  Tobacco Use  . Smoking status: Never Smoker  . Smokeless tobacco: Never Used  Vaping Use  . Vaping Use: Never used  Substance and Sexual Activity  . Alcohol use: Yes    Comment: RARELLY  . Drug use: Never  . Sexual activity: Not on file  Other Topics Concern  . Not on file  Social History Narrative  . Not on file   Social Determinants of Health    Financial Resource Strain: Not on file  Food Insecurity: Not on file  Transportation Needs: Not on file  Physical Activity: Not on file  Stress: Not on file  Social Connections: Not on file    No Known Allergies   Current Outpatient Medications:  .  acetaminophen (TYLENOL) 500 MG tablet, Take 1,000 mg by mouth every 6 (six) hours as needed for moderate pain or headache., Disp: , Rfl:  .  amoxicillin (AMOXIL) 500 MG capsule, Take 1 capsule (500 mg total) by mouth 3 (three) times daily. To start when IV abx stop, Disp: 90 capsule, Rfl: 11 .  cefTRIAXone (ROCEPHIN) IVPB, Inject into the vein., Disp: , Rfl:  .  Multiple Vitamin (MULTIVITAMIN WITH MINERALS) TABS tablet, Take 1 tablet by mouth 2 (two) times a week., Disp: , Rfl:  .  oxyCODONE-acetaminophen (PERCOCET) 5-325 MG tablet, Take 1-2 tablets every 4 hours as needed for post operative pain. MAX 6/day, Disp: 30 tablet, Rfl: 0 .  tiZANidine (ZANAFLEX) 4 MG tablet, Take 1 tablet (4 mg total) by mouth every 8 (eight) hours as needed for muscle spasms., Disp: 30 tablet, Rfl: 1   Past Medical History:  Diagnosis Date  . 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 BILATERAL HEMI SHOULDER ARTHROPLASTY Right   . HERNIA REPAIR    . REVISION TOTAL SHOULDER TO REVERSE TOTAL SHOULDER Right 08/09/2020   Procedure: REVISION TOTAL SHOULDER TO REVERSE TOTAL SHOULDER;  Surgeon: Jones Broom, MD;  Location: [facility];  Service: Orthopedics;  Laterality: Right;  . SHOULDER ARTHROSCOPY    . SHOULDER ARTHROTOMY    . TONSILLECTOMY      Family History  Problem Relation Age of Onset  . Healthy Mother   . Healthy Father       Social History   Socioeconomic History  . Marital status: Married    Spouse name: Not on file  . Number of children: Not on file  . Years of education: Not on file  . Highest education level: Not on file   Occupational History  . Not on file  Tobacco Use  . Smoking status: Never Smoker  . Smokeless tobacco: Never Used  Vaping Use  . Vaping Use: Never used  Substance and Sexual Activity  . Alcohol use: Yes    Comment: RARELLY  . Drug use: Never  . Sexual activity: Not on file  Other Topics Concern  . Not on file  Social History Narrative  . Not on file   Social Determinants of Health   Financial Resource Strain: Not on file  Food Insecurity: Not on file  Transportation Needs: Not on file  Physical Activity: Not on file  Stress: Not on file  Social Connections: Not on file    No Known Allergies   Current Outpatient Medications:  .  acetaminophen (TYLENOL) 500 MG tablet,  Take 1,000 mg by mouth every 6 (six) hours as needed for moderate pain or headache., Disp: , Rfl:  .  amoxicillin (AMOXIL) 500 MG capsule, Take 1 capsule (500 mg total) by mouth 3 (three) times daily. To start when IV abx stop, Disp: 90 capsule, Rfl: 11 .  cefTRIAXone (ROCEPHIN) IVPB, Inject into the vein., Disp: , Rfl:  .  Multiple Vitamin (MULTIVITAMIN WITH MINERALS) TABS tablet, Take 1 tablet by mouth 2 (two) times a week., Disp: , Rfl:  .  oxyCODONE-acetaminophen (PERCOCET) 5-325 MG tablet, Take 1-2 tablets every 4 hours as needed for post operative pain. MAX 6/day, Disp: 30 tablet, Rfl: 0 .  tiZANidine (ZANAFLEX) 4 MG tablet, Take 1 tablet (4 mg total) by mouth every 8 (eight) hours as needed for muscle spasms., Disp: 30 tablet, Rfl: 1   Review of Systems  Constitutional: Negative for chills and fever.  HENT: Negative for congestion and sore throat.   Eyes: Negative for photophobia.  Respiratory: Negative for cough, shortness of breath and wheezing.   Cardiovascular: Negative for chest pain, palpitations and leg swelling.  Gastrointestinal: Negative for abdominal pain, blood in stool, constipation, diarrhea, nausea and vomiting.  Genitourinary: Negative for dysuria, flank pain and hematuria.   Musculoskeletal: Negative for back pain and myalgias.  Skin: Positive for wound. Negative for rash.  Neurological: Negative for dizziness, weakness and headaches.  Hematological: Does not bruise/bleed easily.  Psychiatric/Behavioral: Negative for agitation, behavioral problems, confusion, decreased concentration and suicidal ideas.       Objective:   Physical Exam Constitutional:      General: He is not in acute distress.    Appearance: Normal appearance. He is well-developed. He is not ill-appearing or diaphoretic.  HENT:     Head: Normocephalic and atraumatic.     Right Ear: Hearing and external ear normal.     Left Ear: Hearing and external ear normal.     Nose: No nasal deformity or rhinorrhea.  Eyes:     General: No scleral icterus.    Conjunctiva/sclera: Conjunctivae normal.     Right eye: Right conjunctiva is not injected.     Left eye: Left conjunctiva is not injected.     Pupils: Pupils are equal, round, and reactive to light.  Neck:     Vascular: No JVD.  Cardiovascular:     Rate and Rhythm: Normal rate and regular rhythm.     Heart sounds: S1 normal and S2 normal.  Pulmonary:     Effort: Pulmonary effort is normal. No respiratory distress.     Breath sounds: No wheezing.  Abdominal:     General: There is no distension.     Palpations: Abdomen is soft.  Musculoskeletal:        General: Normal range of motion.     Right shoulder: Normal.     Left shoulder: Normal.     Cervical back: Normal range of motion and neck supple.     Right hip: Normal.     Left hip: Normal.     Right knee: Normal.     Left knee: Normal.  Lymphadenopathy:     Head:     Right side of head: No submandibular, preauricular or posterior auricular adenopathy.     Left side of head: No submandibular, preauricular or posterior auricular adenopathy.     Cervical: No cervical adenopathy.     Right cervical: No superficial or deep cervical adenopathy.    Left cervical: No superficial or deep  cervical adenopathy.  Skin:    General: Skin is warm and dry.     Coloration: Skin is not jaundiced or pale.     Findings: No abrasion, bruising, ecchymosis, erythema, lesion or rash.     Nails: There is no clubbing.  Neurological:     General: No focal deficit present.     Mental Status: He is alert and oriented to person, place, and time.     Sensory: No sensory deficit.     Coordination: Coordination normal.     Gait: Gait normal.  Psychiatric:        Attention and Perception: He is attentive.        Mood and Affect: Mood normal.        Speech: Speech normal.        Behavior: Behavior normal. Behavior is cooperative.        Thought Content: Thought content normal.        Judgment: Judgment normal.     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

## 2020-12-10 ENCOUNTER — Ambulatory Visit (INDEPENDENT_AMBULATORY_CARE_PROVIDER_SITE_OTHER): Payer: 59 | Admitting: Mental Health

## 2020-12-10 ENCOUNTER — Other Ambulatory Visit: Payer: Self-pay

## 2020-12-10 DIAGNOSIS — F4323 Adjustment disorder with mixed anxiety and depressed mood: Secondary | ICD-10-CM | POA: Diagnosis not present

## 2020-12-10 NOTE — Progress Notes (Signed)
[facility] Initial Adult Exam  Name: Darren Caldwell Date: 12/10/2020 MRN: [id number] DOB: 05-Oct-1976 PCP: [facility]  Time spent: 53 minutes  Reason for Visit /Presenting Problem:  He stated he has been married for the past 17 years, 2 daughters. Struggles to get through work, "going through the motions".  Recently had his 7th surgery on his shoulder. Cant go the gym as often which was a main outlet for stress. "the kids say him grumpy lately".  Uses food to cope. At work, he has struggled to [employer], states they usually give it others who are known to those hiring in the company. 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 His wife is a Interior and spatial designer of a children program. 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 Copes w/ chronic pain from his left shoulder. Feels depressed, tends to surpress feelings.   Mental Status Exam:    Appearance:    Casual     Behavior:   Appropriate  Motor:   WNL  Speech/Language:    Clear and Coherent  Affect:   Full range   Mood:   Anxious, pleasant  Thought process:   normal  Thought content:     WNL  Sensory/Perceptual disturbances:     none  Orientation:   x4  Attention:   Good  Concentration:   Good  Memory:   Intact  Fund of knowledge:    Consistent with age and development  Insight:     Good  Judgment:    Good  Impulse Control:   Good    Reported Symptoms:  Rumination, anxiety, sleep problems -wakes often at night  Risk Assessment: Danger to Self:  No Self-injurious Behavior: No Danger to Others: No Duty to Warn:no Physical Aggression / Violence:No  Access to Firearms a concern: No  Gang Involvement:No  Patient / guardian was educated about steps to take if suicide or homicide risk level increases between visits: yes While future psychiatric events cannot be accurately predicted, the patient does not currently require acute inpatient  psychiatric care and does not currently meet [facility] involuntary commitment criteria.  Substance Abuse History: Current substance abuse: No     Past Psychiatric History:   Outpatient Providers: therapy years ago, unknown provider History of Psych Hospitalization: No  Psychological Testing: none  Abuse History: Victim - none Report needed: No. Victim of Neglect:No. Perpetrator - none Witness / Exposure to Domestic Violence: No   Protective Services Involvement: No  Witness to [employer] Violence:  No   Family History:  Raised by mother, bio father left when he was age 2. Has not seen him in 25 years.  Siblings- brother, 2 stepsisters, half sister Good childhood, mother worked several jobs  Family History  Problem Relation Age of Onset  . Healthy Mother   . Healthy Father     Living situation: the patient lives w/ wife and 2 daughters   Sexual Orientation:  hetero  Relationship Status: 20 years married Name of spouse / other: Darren Caldwell             If a parent, number of children / ages: Darren Caldwell- age 91, Darren Caldwell-age 62 (Museum/gallery conservator)  Lawyer; family, friends  Surveyor, quantity Stress:  none  Income/Employment/Disability:  Full time  Financial planner: No   Educational History: Education: college  Religion/Sprituality/World View:   Christian  Any cultural differences that may affect / interfere with treatment:  none  Recreation/Hobbies: working out, Systems analyst, music, sports cards  Stressors:  medical  Strengths: family support  Barriers:   none  Legal History: Pending legal issue / charges: none History of legal issue / charges: none  Medical History/Surgical History:  Past Medical History:  Diagnosis Date  . 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 BILATERAL HEMI SHOULDER ARTHROPLASTY Right   . HERNIA REPAIR    . REVISION TOTAL SHOULDER  TO REVERSE TOTAL SHOULDER Right 08/09/2020   Procedure: REVISION TOTAL SHOULDER TO REVERSE TOTAL SHOULDER;  Surgeon: Jones Broom, MD;  Location: [facility];  Service: Orthopedics;  Laterality: Right;  . SHOULDER ARTHROSCOPY    . SHOULDER ARTHROTOMY    . TONSILLECTOMY      Medications: Current Outpatient Medications  Medication Sig Dispense Refill  . acetaminophen (TYLENOL) 500 MG tablet Take 1,000 mg by mouth every 6 (six) hours as needed for moderate pain or headache.    Marland Kitchen amoxicillin (AMOXIL) 500 MG capsule Take 1 capsule (500 mg total) by mouth 3 (three) times daily. To start when IV abx stop 90 capsule 11  . cefTRIAXone (ROCEPHIN) IVPB Inject into the vein. (Patient not taking: Reported on 10/15/2020)    . Multiple Vitamin (MULTIVITAMIN WITH MINERALS) TABS tablet Take 1 tablet by mouth 2 (two) times a week.    Marland Kitchen oxyCODONE-acetaminophen (PERCOCET) 5-325 MG tablet Take 1-2 tablets every 4 hours as needed for post operative pain. MAX 6/day (Patient not taking: Reported on 10/15/2020) 30 tablet 0  . tiZANidine (ZANAFLEX) 4 MG tablet Take 1 tablet (4 mg total) by mouth every 8 (eight) hours as needed for muscle spasms. (Patient not taking: Reported on 10/15/2020) 30 tablet 1   No current facility-administered medications for this visit.    No Known Allergies  Diagnoses:    ICD-10-CM   1. Adjustment disorder with mixed anxiety and depressed mood  F43.23     Plan of Care: TBD   Waldron Session, [facility]

## 2020-12-27 ENCOUNTER — Ambulatory Visit (INDEPENDENT_AMBULATORY_CARE_PROVIDER_SITE_OTHER): Payer: 59 | Admitting: Mental Health

## 2020-12-27 ENCOUNTER — Other Ambulatory Visit: Payer: Self-pay

## 2020-12-27 DIAGNOSIS — F4323 Adjustment disorder with mixed anxiety and depressed mood: Secondary | ICD-10-CM

## 2020-12-27 NOTE — Progress Notes (Signed)
[facility] Counsel[facility] Psychotherapy Note  Name: Darren Caldwell Date: 01/07/2021 MRN: [id number] DOB: June 16, 1976 PCP: Darrick Grinder, [facility]  Time spent: 53 minutes  Treatment: Individual therapy  Mental Status Exam:    Appearance:    Casual     Behavior:   Appropriate  Motor:   WNL  Speech/Language:    Clear and Coherent  Affect:   Full range   Mood:   Anxious, pleasant  Thought process:   normal  Thought content:     WNL  Sensory/Perceptual disturbances:     none  Orientation:   x4  Attention:   Good  Concentration:   Good  Memory:   Intact  Fund of knowledge:    Consistent with age and development  Insight:     Good  Judgment:    Good  Impulse Control:   Good    Reported Symptoms:  Rumination, anxiety, sleep problems -wakes often at night  Risk Assessment: Danger to Self:  No Self-injurious Behavior: No Danger to Others: No Duty to Warn:no Physical Aggression / Violence:No  Access to Firearms a concern: No  Gang Involvement:No  Patient / guardian was educated about steps to take if suicide or homicide risk level increases between visits: yes While future psychiatric events cannot be accurately predicted, the patient does not currently require acute inpatient psychiatric care and does not currently meet [facility] involuntary commitment criteria.   Medical History/Surgical History:  Past Medical History:  Diagnosis Date  . 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 BILATERAL HEMI SHOULDER ARTHROPLASTY Right   . HERNIA REPAIR    . REVISION TOTAL SHOULDER TO REVERSE TOTAL SHOULDER Right 08/09/2020   Procedure: REVISION TOTAL SHOULDER TO REVERSE TOTAL SHOULDER;  Surgeon: Jones Broom, MD;  Location: [facility];  Service: Orthopedics;  Laterality: Right;  . SHOULDER ARTHROSCOPY    . SHOULDER ARTHROTOMY    . TONSILLECTOMY       Medications: Current Outpatient Medications  Medication Sig Dispense Refill  . acetaminophen (TYLENOL) 500 MG tablet Take 1,000 mg by mouth every 6 (six) hours as needed for moderate pain or headache.    Marland Kitchen amoxicillin (AMOXIL) 500 MG capsule Take 1 capsule (500 mg total) by mouth 3 (three) times daily. To start when IV abx stop 90 capsule 11  . cefTRIAXone (ROCEPHIN) IVPB Inject into the vein. (Patient not taking: Reported on 10/15/2020)    . Multiple Vitamin (MULTIVITAMIN WITH MINERALS) TABS tablet Take 1 tablet by mouth 2 (two) times a week.    Marland Kitchen oxyCODONE-acetaminophen (PERCOCET) 5-325 MG tablet Take 1-2 tablets every 4 hours as needed for post operative pain. MAX 6/day (Patient not taking: Reported on 10/15/2020) 30 tablet 0  . tiZANidine (ZANAFLEX) 4 MG tablet Take 1 tablet (4 mg total) by mouth every 8 (eight) hours as needed for muscle spasms. (Patient not taking: Reported on 10/15/2020) 30 tablet 1   No current facility-administered medications for this visit.    Subjective: Patient presents for session on time.  We continue to assess, completing the second component of the initial intake assessment with patient.  He provide more relevant history related to struggles the family has had over the past several years.  He stated his wife struggled with alcohol abuse, received treatment and has been abstinent for the past 4 years.  He stated this considerably improved the relationship and affected the family.  Through  guided discovery, he identified feelings of guilt and shame related to knowing that his daughters have been worried about him, making comments at times about how he "really needs this" referring to his getting a job offer from his many attempts to get another position in the company.  He stated he tries to remind himself to practice gratitude as he has a stable job and adequate income.  He also identified needing to engage in pleasurable activities individually as well as home with  family.  He stated that he had a meaningful discussion recently with his wife where he was able to share his feelings with her.  He feels this is a positive sign for him as this is been difficult in the past.  We discussed coping skills related to thought blocking as well as radical acceptance.  Interventions: Further assessment, CBT, supportive therapy  Diagnoses:    ICD-10-CM   1. Adjustment disorder with mixed anxiety and depressed mood  F43.23     Plan: Patient is to use CBT, mindfulness and coping skills to help manage decrease symptoms associated with their diagnosis.  Patient to make time to engage in pleasurable interests and utilize coping skills as discussed.   Long-term goal:   Reduce overall level, frequency, and intensity of the feelings of depression, anxiety and panic evidenced by decreased irritability, negative self talk per client report for at least 3 consecutive months.  Short-term goal:  Verbally express understanding of the relationship between feelings of depression, anxiety and their impact on thinking patterns and behaviors. Verbalize an understanding of the role that distorted thinking plays in creating fears, excessive worry, and ruminations. Increase ability to express his emotions through his communication with his wife   Bevan Disney, [facility]

## 2021-01-10 ENCOUNTER — Ambulatory Visit: Payer: 59 | Admitting: Mental Health

## 2021-01-14 ENCOUNTER — Ambulatory Visit (INDEPENDENT_AMBULATORY_CARE_PROVIDER_SITE_OTHER): Payer: 59 | Admitting: Mental Health

## 2021-01-14 ENCOUNTER — Other Ambulatory Visit: Payer: Self-pay

## 2021-01-14 DIAGNOSIS — F4323 Adjustment disorder with mixed anxiety and depressed mood: Secondary | ICD-10-CM

## 2021-01-14 NOTE — Progress Notes (Signed)
[facility] Psychotherapy Note  Name: Anish Vana Date: 01/14/2021 MRN: [id number] DOB: [birth year] PCP: Darrick Grinder, [facility]  Time spent: 52 minutes  Treatment: Individual therapy  Mental Status Exam:    Appearance:    Casual     Behavior:   Appropriate  Motor:   WNL  Speech/Language:    Clear and Coherent  Affect:   Full range   Mood:   Anxious, pleasant  Thought process:   normal  Thought content:     WNL  Sensory/Perceptual disturbances:     none  Orientation:   x4  Attention:   Good  Concentration:   Good  Memory:   Intact  Fund of knowledge:    Consistent with age and development  Insight:     Good  Judgment:    Good  Impulse Control:   Good    Reported Symptoms:  Rumination, anxiety, sleep problems -wakes often at night  Risk Assessment: Danger to Self:  No Self-injurious Behavior: No Danger to Others: No Duty to Warn:no Physical Aggression / Violence:No  Access to Firearms a concern: No  Gang Involvement:No  Patient / guardian was educated about steps to take if suicide or homicide risk level increases between visits: yes While future psychiatric events cannot be accurately predicted, the patient does not currently require acute inpatient psychiatric care and does not currently meet [facility] involuntary commitment criteria.   Medical History/Surgical History:  Past Medical History:  Diagnosis Date  . 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 BILATERAL HEMI SHOULDER ARTHROPLASTY Right   . HERNIA REPAIR    . REVISION TOTAL SHOULDER TO REVERSE TOTAL SHOULDER Right 08/09/2020   Procedure: REVISION TOTAL SHOULDER TO REVERSE TOTAL SHOULDER;  Surgeon: Jones Broom, MD;  Location: [facility];  Service: Orthopedics;  Laterality: Right;  . SHOULDER ARTHROSCOPY    . SHOULDER ARTHROTOMY    . TONSILLECTOMY       Medications: Current Outpatient Medications  Medication Sig Dispense Refill  . acetaminophen (TYLENOL) 500 MG tablet Take 1,000 mg by mouth every 6 (six) hours as needed for moderate pain or headache.    Marland Kitchen amoxicillin (AMOXIL) 500 MG capsule Take 1 capsule (500 mg total) by mouth 3 (three) times daily. To start when IV abx stop 90 capsule 11  . cefTRIAXone (ROCEPHIN) IVPB Inject into the vein. (Patient not taking: Reported on 10/15/2020)    . Multiple Vitamin (MULTIVITAMIN WITH MINERALS) TABS tablet Take 1 tablet by mouth 2 (two) times a week.    Marland Kitchen oxyCODONE-acetaminophen (PERCOCET) 5-325 MG tablet Take 1-2 tablets every 4 hours as needed for post operative pain. MAX 6/day (Patient not taking: Reported on 10/15/2020) 30 tablet 0  . tiZANidine (ZANAFLEX) 4 MG tablet Take 1 tablet (4 mg total) by mouth every 8 (eight) hours as needed for muscle spasms. (Patient not taking: Reported on 10/15/2020) 30 tablet 1   No current facility-administered medications for this visit.    Subjective: Patient presents for session on time.  He stated that he has felt somewhat less stressed since her last session.  He stated that he is work to be mindful of his thoughts about his job, tries to take the time to practice gratitude.  He stated that 1 fellow employee got a promotion which was somewhat difficult to hear however, he stated that he is trying not to focus on this as it  reminds him of him being turned down on other promotions he is attempted to obtain for himself over the years through the company.  He stated that he continues to struggle with his tendency to cope with food.  We continue to explore collaboratively in session where he stated that he knows what to do, to be disciplined when making his food choices, admitting that in the evenings his tendency is to eat sweets.  He shared how he and his wife continue to work on the relationship and they are doing well per his report.  He stated that he has had some  challenges with intimacy and wants to work on losing some weight as discussed may be a contributing factor.  Assisted him in identifying needs where he stated that he would like them to have more quality time together, with no pressure or conditions toward intimacy as he stated that he feels that also could play a role in his performance issues.  He stated he is also been more verbal about his feelings in the relationship with his wife, as he is working to not suppress his feelings as he has done in the past.  Interventions: Further assessment, CBT, supportive therapy  Diagnoses:    ICD-10-CM   1. Adjustment disorder with mixed anxiety and depressed mood  F43.23     Plan: Patient is to use CBT, mindfulness and coping skills to help manage decrease symptoms associated with their diagnosis.  Patient to make time to engage in pleasurable interests and utilize coping skills as discussed.  Continue to work on his communication in his marital relationship as opposed to suppressing feelings.   Long-term goal:   Reduce overall level, frequency, and intensity of the feelings of depression, anxiety and panic evidenced by decreased irritability, negative self talk per client report for at least 3 consecutive months.  Short-term goal:  Verbally express understanding of the relationship between feelings of depression, anxiety and their impact on thinking patterns and behaviors. Verbalize an understanding of the role that distorted thinking plays in creating fears, excessive worry, and ruminations. Increase ability to express his emotions through his communication with his wife   Dash Cardarelli, [facility]

## 2021-01-24 ENCOUNTER — Other Ambulatory Visit: Payer: Self-pay

## 2021-01-24 ENCOUNTER — Ambulatory Visit (INDEPENDENT_AMBULATORY_CARE_PROVIDER_SITE_OTHER): Payer: 59 | Admitting: Mental Health

## 2021-01-24 DIAGNOSIS — F4323 Adjustment disorder with mixed anxiety and depressed mood: Secondary | ICD-10-CM

## 2021-01-24 NOTE — Progress Notes (Signed)
[facility] Psychotherapy Note  Name: Darren Caldwell Date: 01/24/2021 MRN: [id number] DOB: Feb 27, 1976 PCP: Olga Coaster, [facility]  Time spent: 54 minutes  Treatment: Individual therapy  Mental Status Exam:    Appearance:    Casual     Behavior:   Appropriate  Motor:   WNL  Speech/Language:    Clear and Coherent  Affect:   Full range   Mood:   Anxious, pleasant  Thought process:   normal  Thought content:     WNL  Sensory/Perceptual disturbances:     none  Orientation:   x4  Attention:   Good  Concentration:   Good  Memory:   Intact  Fund of knowledge:    Consistent with age and development  Insight:     Good  Judgment:    Good  Impulse Control:   Good    Reported Symptoms:  Rumination, anxiety, sleep problems -wakes often at night  Risk Assessment: Danger to Self:  No Self-injurious Behavior: No Danger to Others: No Duty to Warn:no Physical Aggression / Violence:No  Access to Firearms a concern: No  Gang Involvement:No  Patient / guardian was educated about steps to take if suicide or homicide risk level increases between visits: yes While future psychiatric events cannot be accurately predicted, the patient does not currently require acute inpatient psychiatric care and does not currently meet [facility] involuntary commitment criteria.   Medical History/Surgical History:  Past Medical History:  Diagnosis Date  . 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 Prosthetic shoulder infection ([place]) 08/20/2020    Past Surgical History:  Procedure Laterality Date  . BILATERAL HEMI SHOULDER ARTHROPLASTY Right   . HERNIA REPAIR    . REVISION TOTAL SHOULDER TO REVERSE TOTAL SHOULDER Right 08/09/2020   Procedure: REVISION TOTAL SHOULDER TO REVERSE TOTAL SHOULDER;  Surgeon: Tania Ade, MD;  Location: [facility];  Service: Orthopedics;  Laterality: Right;  . SHOULDER ARTHROSCOPY    . SHOULDER ARTHROTOMY    . TONSILLECTOMY       Medications: Current Outpatient Medications  Medication Sig Dispense Refill  . acetaminophen (TYLENOL) 500 MG tablet Take 1,000 mg by mouth every 6 (six) hours as needed for moderate pain or headache.    Marland Kitchen amoxicillin (AMOXIL) 500 MG capsule Take 1 capsule (500 mg total) by mouth 3 (three) times daily. To start when IV abx stop 90 capsule 11  . cefTRIAXone (ROCEPHIN) IVPB Inject into the vein. (Patient not taking: Reported on 10/15/2020)    . Multiple Vitamin (MULTIVITAMIN WITH MINERALS) TABS tablet Take 1 tablet by mouth 2 (two) times a week.    Marland Kitchen oxyCODONE-acetaminophen (PERCOCET) 5-325 MG tablet Take 1-2 tablets every 4 hours as needed for post operative pain. MAX 6/day (Patient not taking: Reported on 10/15/2020) 30 tablet 0  . tiZANidine (ZANAFLEX) 4 MG tablet Take 1 tablet (4 mg total) by mouth every 8 (eight) hours as needed for muscle spasms. (Patient not taking: Reported on 10/15/2020) 30 tablet 1   No current facility-administered medications for this visit.    Subjective: Patient presents for session on time.  Patient shared recent events and progress continues to not be filled out at his job.  Met with his boss yesterday for quarterly review in which he did well however, does not see any possibility for promotion to a different position which is ultimately what he would like.  He identified the need to not discuss it so often with his wife as he feels he is  talking about the same issue which is his not being satisfied at work.  He stated they continue to work on the relationship and have some intimacy issues at times for unknown reasons.  Patient was encouraged to give himself time to identify be present with thoughts that might be connected to his feelings of low self-esteem which he identified today in session.  He identified also the need for he and his wife to have more quality time with 1 another which she feels may help is the pressure he puts on himself.  Patient was encouraged to  identify calming, self supportive self talk with which to be mindful between sessions.  Interventions: Further assessment, CBT, supportive therapy  Diagnoses:    ICD-10-CM   1. Adjustment disorder with mixed anxiety and depressed mood  F43.23     Plan: Patient is to use CBT, mindfulness and coping skills to help manage decrease symptoms associated with their diagnosis.  Patient to make time to engage in pleasurable interests and utilize coping skills as discussed.  Continue to work on his communication in his marital relationship as opposed to suppressing feelings.   Long-term goal:   Reduce overall level, frequency, and intensity of the feelings of depression, anxiety and panic evidenced by decreased irritability, negative self talk per client report for at least 3 consecutive months.  Short-term goal:  Verbally express understanding of the relationship between feelings of depression, anxiety and their impact on thinking patterns and behaviors. Verbalize an understanding of the role that distorted thinking plays in creating fears, excessive worry, and ruminations. Increase ability to express his emotions through his communication with his wife   Darren Caldwell, [facility]

## 2021-02-14 ENCOUNTER — Other Ambulatory Visit: Payer: Self-pay

## 2021-02-14 ENCOUNTER — Ambulatory Visit (INDEPENDENT_AMBULATORY_CARE_PROVIDER_SITE_OTHER): Payer: 59 | Admitting: Mental Health

## 2021-02-14 DIAGNOSIS — F4323 Adjustment disorder with mixed anxiety and depressed mood: Secondary | ICD-10-CM | POA: Diagnosis not present

## 2021-02-14 NOTE — Progress Notes (Signed)
[facility] Psychotherapy Note  Name: Helios Kohlmann Date: 02/14/2021 MRN: [id number] DOB: 04-Dec-1975 PCP: Darrick Grinder, [facility]  Time spent: 54 minutes  Treatment: Individual therapy  Mental Status Exam:    Appearance:    Casual     Behavior:   Appropriate  Motor:   WNL  Speech/Language:    Clear and Coherent  Affect:   Full range   Mood:   Anxious, pleasant  Thought process:   normal  Thought content:     WNL  Sensory/Perceptual disturbances:     none  Orientation:   x4  Attention:   Good  Concentration:   Good  Memory:   Intact  Fund of knowledge:    Consistent with age and development  Insight:     Good  Judgment:    Good  Impulse Control:   Good    Reported Symptoms:  Rumination, anxiety, sleep problems -wakes often at night  Risk Assessment: Danger to Self:  No Self-injurious Behavior: No Danger to Others: No Duty to Warn:no Physical Aggression / Violence:No  Access to Firearms a concern: No  Gang Involvement:No  Patient / guardian was educated about steps to take if suicide or homicide risk level increases between visits: yes While future psychiatric events cannot be accurately predicted, the patient does not currently require acute inpatient psychiatric care and does not currently meet [facility] involuntary commitment criteria.   Medical History/Surgical History:  Past Medical History:  Diagnosis Date  . 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 BILATERAL HEMI SHOULDER ARTHROPLASTY Right   . HERNIA REPAIR    . REVISION TOTAL SHOULDER TO REVERSE TOTAL SHOULDER Right 08/09/2020   Procedure: REVISION TOTAL SHOULDER TO REVERSE TOTAL SHOULDER;  Surgeon: Jones Broom, MD;  Location: [facility];  Service: Orthopedics;  Laterality: Right;  . SHOULDER ARTHROSCOPY    . SHOULDER ARTHROTOMY    . TONSILLECTOMY       Medications: Current Outpatient Medications  Medication Sig Dispense Refill  . acetaminophen (TYLENOL) 500 MG tablet Take 1,000 mg by mouth every 6 (six) hours as needed for moderate pain or headache.    Marland Kitchen amoxicillin (AMOXIL) 500 MG capsule Take 1 capsule (500 mg total) by mouth 3 (three) times daily. To start when IV abx stop 90 capsule 11  . cefTRIAXone (ROCEPHIN) IVPB Inject into the vein. (Patient not taking: Reported on 10/15/2020)    . Multiple Vitamin (MULTIVITAMIN WITH MINERALS) TABS tablet Take 1 tablet by mouth 2 (two) times a week.    Marland Kitchen oxyCODONE-acetaminophen (PERCOCET) 5-325 MG tablet Take 1-2 tablets every 4 hours as needed for post operative pain. MAX 6/day (Patient not taking: Reported on 10/15/2020) 30 tablet 0  . tiZANidine (ZANAFLEX) 4 MG tablet Take 1 tablet (4 mg total) by mouth every 8 (eight) hours as needed for muscle spasms. (Patient not taking: Reported on 10/15/2020) 30 tablet 1   No current facility-administered medications for this visit.    Subjective: Patient presents for session on time.  Shared progress, continues to struggle with motivation at work.  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  He identified the need to integrate more activity day-to-day with his job, specifically his going out and  making face-to-face calls with certain offices.  Reviewed the concept of radical acceptance and discussed in session facilitating patient to identify her thoughts related to himself regarding this concept.  He further identified the negative cognition "what is the point" regarding his going out and making more of an effort with work where this eventually shifted toward his identifying the need to integrate this into his schedule with the hope that it will provide lasting change with his job satisfaction  to a degree.  He identified then the positive cognition, "it is once was to be doing, and I need this".  He plans to schedule at least 2 times a week where he is to get out and make face-to-face sales calls.  He went on to share other interpersonal issues related to his relationships, [place] feeling that they have higher expectations of him than his siblings, needing more from his marital relationship from his wife where he identified needing more affirmation as he can feel that he often "gives" in their relationship and others, but does not feel that he receives enough emotional support and return.  He plans to potentially have a more specific discussion with his wife and we discussed some ways to communicate these needs.   Interventions: Further assessment, CBT, supportive therapy  Diagnoses:    ICD-10-CM   1. Adjustment disorder with mixed anxiety and depressed mood  F43.23     Plan: Patient is to use CBT, mindfulness and coping skills to help manage decrease symptoms associated with their diagnosis.  Patient to make time to engage in pleasurable interests and utilize coping skills as discussed.  Continue to work on his communication in his marital relationship as opposed to suppressing feelings.   Long-term goal:   Reduce overall level, frequency, and intensity of the feelings of depression, anxiety and panic evidenced by decreased irritability, negative self talk per client report for at least 3 consecutive months.  Short-term goal:  Verbally express understanding of the relationship between feelings of depression, anxiety and their impact on thinking patterns and behaviors. Verbalize an understanding of the role that distorted thinking plays in creating fears, excessive worry, and ruminations. Increase ability to express his emotions through his communication with his wife   Nicola Quesnell, [facility]

## 2021-03-04 ENCOUNTER — Ambulatory Visit: Payer: 59 | Admitting: Mental Health

## 2021-03-18 ENCOUNTER — Ambulatory Visit: Payer: 59 | Admitting: Mental Health

## 2021-04-16 ENCOUNTER — Ambulatory Visit: Payer: 59 | Admitting: Infectious Disease

## 2021-04-24 ENCOUNTER — Ambulatory Visit (INDEPENDENT_AMBULATORY_CARE_PROVIDER_SITE_OTHER): Payer: 59 | Admitting: Infectious Disease

## 2021-04-24 ENCOUNTER — Encounter: Payer: Self-pay | Admitting: Infectious Disease

## 2021-04-24 ENCOUNTER — Other Ambulatory Visit: Payer: Self-pay

## 2021-04-24 DIAGNOSIS — A498 Other bacterial infections of unspecified site: Secondary | ICD-10-CM

## 2021-04-24 DIAGNOSIS — M19011 Primary osteoarthritis, right shoulder: Secondary | ICD-10-CM

## 2021-04-24 DIAGNOSIS — E669 Obesity, unspecified: Secondary | ICD-10-CM

## 2021-04-24 DIAGNOSIS — U071 COVID-19: Secondary | ICD-10-CM | POA: Diagnosis not present

## 2021-04-24 DIAGNOSIS — Z96619 Presence of unspecified artificial shoulder joint: Secondary | ICD-10-CM

## 2021-04-24 DIAGNOSIS — T8459XD Infection and inflammatory reaction due to other internal joint prosthesis, subsequent encounter: Secondary | ICD-10-CM | POA: Diagnosis not present

## 2021-04-24 DIAGNOSIS — Z6834 Body mass index (BMI) 34.0-34.9, adult: Secondary | ICD-10-CM

## 2021-04-24 HISTORY — DX: COVID-19: U07.1

## 2021-04-24 MED ORDER — AMOXICILLIN 500 MG PO CAPS: 500.0000 mg | ORAL_CAPSULE | Freq: Three times a day (TID) | ORAL | 11 refills | Status: AC

## 2021-04-24 MED ORDER — NIRMATRELVIR/RITONAVIR (PAXLOVID)TABLET: 3.0000 | ORAL_TABLET | Freq: Two times a day (BID) | ORAL | 0 refills | Status: AC

## 2021-04-24 NOTE — Progress Notes (Signed)
Virtual Visit via Telephone Note  I connected with Darren Caldwell on 04/24/21 at  2:15 PM EDT by telephone and verified that I am speaking with the correct person using two identifiers.  Location: Patient: Home Provider: [facility]   I discussed the limitations, risks, security and privacy concerns of performing an evaluation and management service by telephone and the availability of in person appointments. I also discussed with the patient that there may be a patient responsible charge related to this service. The patient expressed understanding and agreed to proceed.   History of Present Illness: 45 yo with a complex R shoulder history. Per chart review and patient, approximately 6 years ago he underwent arthroscopic R shoulder RCR, SLAP repair and biceps tenodesis at an outside instition. This was then followed by an arthroscopic debridement several years later. He continued to have pain, he then underwent hemiarthroplasty of the right shoulder with Dr. Jacklyn Shell in 05/2018. His symptoms of pain and instability persisted and he eventually presented to Dr. Case in 2019 who performed explant, antibiotic spacer with cultures growing P.acnes. He then underwent TSA in 07/2019 with Dr. Case. Most recently in 12/2019 he experienced continued instability, weakness and pain and underwent open subscapularis repair.  He was evaluated by Dr. Ave Filter who him with the option for revision with reverse shoulder arthroplasty to try to salvage shoulder function.  Dr. Ave Filter took him to the operating room on August 09, 2020 and performed revision of his total shoulder to a reverse total shoulder.  The OR attempts were made to preserve the stem from prior shoulder but this was unsuccessful.  All the implants had to be removed.  Intraoperative cultures were taken new hardware placed.   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   Patient was on doxycycline.  Dr. Ave Filter  reached  out to my partner Dr. Drue Second who recommended placement of PICC line and initiation of ceftriaxone which the patient has been on.   She had also recommended him switch to amoxicillin prior to PICC line placement but he simply gone from doxycycline to ceftriaxone.  Since then he has completed ceftriaxone and gone on to oral amoxicillin.   He stopped amoxicillin roughly 2 weeks ago because when he came to the pharmacy he was told by the pharmacy that they did not have anything for him but they did have amoxicillin with 11 refills from my original prescription.  His shoulder is doing well and he is not having much pain there at all.  Have asked him to go back on the amoxicillin and have sent a new prescription with plans for seeing him in October.  Since I last saw him he developed COVID-19 infection.  His his wife became symptomatic over the weekend and had tested positive he developed symptoms today with headache and some chills and body aches.  He has not yet been started on antiviral therapy.   Has received 2 vaccines.   Observations/Objective:  He appeared to be doing relatively well clinically   Assessment and Plan:  COVID 19: Main risk factor is BMI being elevated.  Prescribing Paxlovid  PJI: Continue amoxicillin and see him back in October  Follow Up Instructions:    I discussed the assessment and treatment plan with the patient. The patient was provided an opportunity to ask questions and all were answered. The patient agreed with the plan and demonstrated an understanding of the instructions.   The patient was advised to call back or seek  an in-person evaluation if the symptoms worsen or if the condition fails to improve as anticipated.  I provided 23 minutes of non-face-to-face time during this encounter.   Acey Lav, MD

## 2021-07-25 ENCOUNTER — Ambulatory Visit: Payer: 59 | Admitting: Infectious Disease

## 2021-07-26 ENCOUNTER — Telehealth: Payer: Self-pay

## 2021-07-26 ENCOUNTER — Ambulatory Visit: Payer: 59 | Admitting: Infectious Disease

## 2021-07-26 NOTE — Telephone Encounter (Signed)
Called patient to see if he would make it to today's appointment, no answer.  Sandie Ano, RN

## 2021-08-05 NOTE — Telephone Encounter (Signed)
Connected with patient regarding rescheduling appointment. Patient apologetic for missing previously scheduled appointment. Patient states he is currently leaving town until Friday and will give the office a call once he returns.  Advised patient that staff will send a Mychart message to remind patient about scheduling. Patient appreciative of call. Valarie Cones

## 2022-01-28 IMAGING — DX DG SHOULDER 2+V PORT*R*
1 series · 1 of 1 positions shown · non-contrast
Comparison: CT 05/18/2018 report.

CLINICAL DATA: Shoulder replacement.

EXAM:
PORTABLE RIGHT SHOULDER

[shoulder ap]
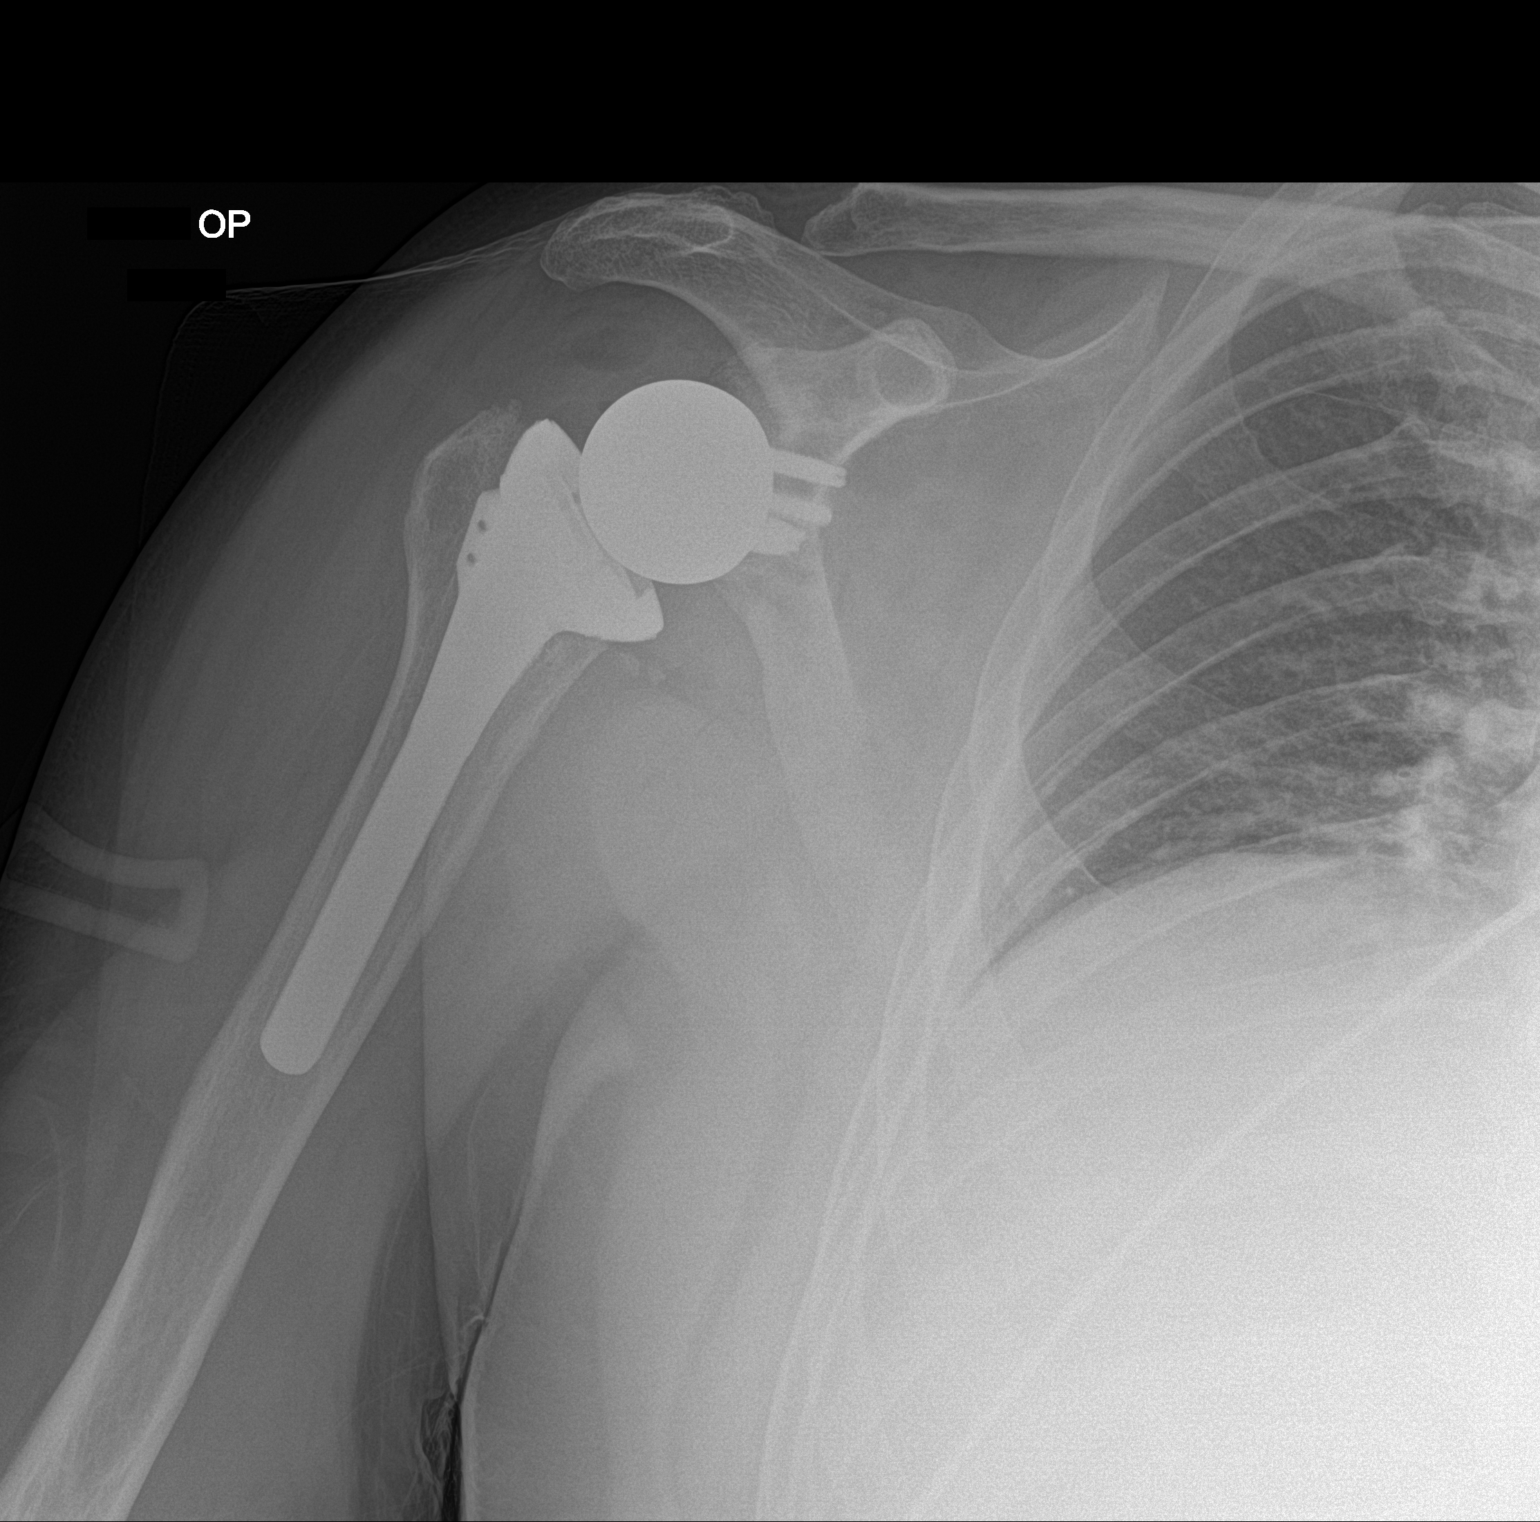

[1 of 1 positions shown; findings below may reference images not displayed]

FINDINGS: Total right shoulder replacement. Hardware intact. Anatomic
alignment.
IMPRESSION: Total right shoulder replacement with anatomic alignment.

## 2023-06-02 ENCOUNTER — Other Ambulatory Visit (HOSPITAL_BASED_OUTPATIENT_CLINIC_OR_DEPARTMENT_OTHER): Payer: Self-pay

## 2023-06-02 MED ORDER — ZEPBOUND 10 MG/0.5ML ~~LOC~~ SOAJ
10.0000 mg | SUBCUTANEOUS | 2 refills | Status: DC
  Filled 2023-06-02: qty 2, 28d supply, fill #0
  Filled 2023-06-29: qty 2, 28d supply, fill #1

## 2023-06-29 ENCOUNTER — Other Ambulatory Visit (HOSPITAL_BASED_OUTPATIENT_CLINIC_OR_DEPARTMENT_OTHER): Payer: Self-pay

## 2023-07-01 ENCOUNTER — Other Ambulatory Visit (HOSPITAL_BASED_OUTPATIENT_CLINIC_OR_DEPARTMENT_OTHER): Payer: Self-pay

## 2023-07-01 MED ORDER — ZEPBOUND 5 MG/0.5ML ~~LOC~~ SOAJ
5.0000 mg | SUBCUTANEOUS | 2 refills | Status: DC
  Filled 2023-07-01 – 2023-07-28 (×2): qty 2, 28d supply, fill #0
  Filled 2023-08-18: qty 2, 28d supply, fill #1

## 2023-07-28 ENCOUNTER — Other Ambulatory Visit (HOSPITAL_BASED_OUTPATIENT_CLINIC_OR_DEPARTMENT_OTHER): Payer: Self-pay

## 2023-08-18 ENCOUNTER — Other Ambulatory Visit (HOSPITAL_BASED_OUTPATIENT_CLINIC_OR_DEPARTMENT_OTHER): Payer: Self-pay

## 2023-12-03 ENCOUNTER — Other Ambulatory Visit (HOSPITAL_BASED_OUTPATIENT_CLINIC_OR_DEPARTMENT_OTHER): Payer: Self-pay

## 2023-12-03 ENCOUNTER — Other Ambulatory Visit (HOSPITAL_COMMUNITY): Payer: Self-pay

## 2023-12-03 MED ORDER — ZEPBOUND 10 MG/0.5ML ~~LOC~~ SOAJ
10.0000 mg | SUBCUTANEOUS | 2 refills | Status: DC
  Filled 2023-12-03: qty 2, 28d supply, fill #0
  Filled 2023-12-29: qty 2, 28d supply, fill #1
  Filled 2024-01-26: qty 2, 28d supply, fill #2

## 2023-12-29 ENCOUNTER — Other Ambulatory Visit (HOSPITAL_BASED_OUTPATIENT_CLINIC_OR_DEPARTMENT_OTHER): Payer: Self-pay

## 2024-01-26 ENCOUNTER — Other Ambulatory Visit (HOSPITAL_BASED_OUTPATIENT_CLINIC_OR_DEPARTMENT_OTHER): Payer: Self-pay

## 2024-02-18 ENCOUNTER — Other Ambulatory Visit (HOSPITAL_BASED_OUTPATIENT_CLINIC_OR_DEPARTMENT_OTHER): Payer: Self-pay

## 2024-02-18 MED ORDER — ZEPBOUND 10 MG/0.5ML ~~LOC~~ SOAJ
10.0000 mg | SUBCUTANEOUS | 2 refills | Status: DC
  Filled 2024-02-18: qty 2, 28d supply, fill #0
  Filled 2024-03-17: qty 2, 28d supply, fill #1

## 2024-02-19 ENCOUNTER — Other Ambulatory Visit (HOSPITAL_BASED_OUTPATIENT_CLINIC_OR_DEPARTMENT_OTHER): Payer: Self-pay

## 2024-03-17 ENCOUNTER — Other Ambulatory Visit (HOSPITAL_BASED_OUTPATIENT_CLINIC_OR_DEPARTMENT_OTHER): Payer: Self-pay

## 2024-03-21 ENCOUNTER — Other Ambulatory Visit (HOSPITAL_BASED_OUTPATIENT_CLINIC_OR_DEPARTMENT_OTHER): Payer: Self-pay

## 2024-03-21 MED ORDER — TESTOSTERONE CYPIONATE 100 MG/ML IM SOLN
100.0000 mg | INTRAMUSCULAR | 0 refills | Status: AC
Start: 2024-03-21 — End: ?
  Filled 2024-03-21: qty 10, 28d supply, fill #0

## 2024-03-22 ENCOUNTER — Other Ambulatory Visit (HOSPITAL_BASED_OUTPATIENT_CLINIC_OR_DEPARTMENT_OTHER): Payer: Self-pay

## 2024-04-11 ENCOUNTER — Other Ambulatory Visit (HOSPITAL_BASED_OUTPATIENT_CLINIC_OR_DEPARTMENT_OTHER): Payer: Self-pay

## 2024-04-11 MED ORDER — TESTOSTERONE CYPIONATE 200 MG/ML IM SOLN
100.0000 mg | INTRAMUSCULAR | 3 refills | Status: AC
Start: 2024-04-11 — End: ?
  Filled 2024-04-11 – 2024-04-14 (×3): qty 4, 28d supply, fill #0

## 2024-04-13 ENCOUNTER — Other Ambulatory Visit (HOSPITAL_BASED_OUTPATIENT_CLINIC_OR_DEPARTMENT_OTHER): Payer: Self-pay

## 2024-04-13 MED ORDER — ZEPBOUND 10 MG/0.5ML ~~LOC~~ SOAJ
10.0000 mg | SUBCUTANEOUS | 2 refills | Status: DC
  Filled 2024-04-13: qty 2, 28d supply, fill #0
  Filled 2024-05-13: qty 2, 28d supply, fill #1
  Filled 2024-06-09: qty 2, 28d supply, fill #2

## 2024-04-14 ENCOUNTER — Other Ambulatory Visit (HOSPITAL_BASED_OUTPATIENT_CLINIC_OR_DEPARTMENT_OTHER): Payer: Self-pay

## 2024-04-14 ENCOUNTER — Other Ambulatory Visit: Payer: Self-pay

## 2024-05-13 ENCOUNTER — Other Ambulatory Visit (HOSPITAL_BASED_OUTPATIENT_CLINIC_OR_DEPARTMENT_OTHER): Payer: Self-pay

## 2024-06-09 ENCOUNTER — Other Ambulatory Visit (HOSPITAL_BASED_OUTPATIENT_CLINIC_OR_DEPARTMENT_OTHER): Payer: Self-pay

## 2024-06-09 MED ORDER — TESTOSTERONE CYPIONATE 200 MG/ML IM SOLN
100.0000 mg | INTRAMUSCULAR | 3 refills | Status: AC
  Filled 2024-06-09: qty 4, 28d supply, fill #0

## 2024-06-10 ENCOUNTER — Other Ambulatory Visit (HOSPITAL_BASED_OUTPATIENT_CLINIC_OR_DEPARTMENT_OTHER): Payer: Self-pay

## 2024-07-07 ENCOUNTER — Other Ambulatory Visit (HOSPITAL_COMMUNITY): Payer: Self-pay

## 2024-07-07 ENCOUNTER — Other Ambulatory Visit (HOSPITAL_BASED_OUTPATIENT_CLINIC_OR_DEPARTMENT_OTHER): Payer: Self-pay

## 2024-07-07 MED ORDER — TESTOSTERONE CYPIONATE 200 MG/ML IM SOLN
100.0000 mg | INTRAMUSCULAR | 3 refills | Status: AC
  Filled 2024-07-07: qty 4, 28d supply, fill #0

## 2024-07-08 ENCOUNTER — Other Ambulatory Visit (HOSPITAL_BASED_OUTPATIENT_CLINIC_OR_DEPARTMENT_OTHER): Payer: Self-pay

## 2024-07-12 ENCOUNTER — Encounter (HOSPITAL_BASED_OUTPATIENT_CLINIC_OR_DEPARTMENT_OTHER): Payer: Self-pay

## 2024-07-12 ENCOUNTER — Other Ambulatory Visit (HOSPITAL_BASED_OUTPATIENT_CLINIC_OR_DEPARTMENT_OTHER): Payer: Self-pay

## 2024-08-02 ENCOUNTER — Other Ambulatory Visit (HOSPITAL_BASED_OUTPATIENT_CLINIC_OR_DEPARTMENT_OTHER): Payer: Self-pay

## 2024-08-02 MED ORDER — TESTOSTERONE CYPIONATE 200 MG/ML IM SOLN
100.0000 mg | INTRAMUSCULAR | 3 refills | Status: AC
  Filled 2024-08-05: qty 4, 28d supply, fill #0

## 2024-08-04 ENCOUNTER — Other Ambulatory Visit (HOSPITAL_BASED_OUTPATIENT_CLINIC_OR_DEPARTMENT_OTHER): Payer: Self-pay

## 2024-08-05 ENCOUNTER — Other Ambulatory Visit (HOSPITAL_BASED_OUTPATIENT_CLINIC_OR_DEPARTMENT_OTHER): Payer: Self-pay

## 2024-08-08 ENCOUNTER — Other Ambulatory Visit (HOSPITAL_BASED_OUTPATIENT_CLINIC_OR_DEPARTMENT_OTHER): Payer: Self-pay

## 2024-08-08 MED ORDER — ZEPBOUND 12.5 MG/0.5ML ~~LOC~~ SOAJ
12.5000 mg | SUBCUTANEOUS | 1 refills | Status: DC
  Filled 2024-08-08: qty 2, 28d supply, fill #0
  Filled 2024-09-09: qty 2, 28d supply, fill #1

## 2024-09-02 ENCOUNTER — Other Ambulatory Visit (HOSPITAL_BASED_OUTPATIENT_CLINIC_OR_DEPARTMENT_OTHER): Payer: Self-pay

## 2024-09-02 MED ORDER — TESTOSTERONE CYPIONATE 200 MG/ML IM SOLN
100.0000 mg | INTRAMUSCULAR | 3 refills | Status: AC
  Filled 2024-09-02: qty 4, 28d supply, fill #0
  Filled 2024-09-02: qty 2, 28d supply, fill #0
  Filled 2024-09-30 (×2): qty 4, 28d supply, fill #1

## 2024-09-05 ENCOUNTER — Other Ambulatory Visit (HOSPITAL_BASED_OUTPATIENT_CLINIC_OR_DEPARTMENT_OTHER): Payer: Self-pay

## 2024-09-09 ENCOUNTER — Other Ambulatory Visit (HOSPITAL_BASED_OUTPATIENT_CLINIC_OR_DEPARTMENT_OTHER): Payer: Self-pay

## 2024-09-30 ENCOUNTER — Other Ambulatory Visit (HOSPITAL_BASED_OUTPATIENT_CLINIC_OR_DEPARTMENT_OTHER): Payer: Self-pay

## 2024-09-30 MED ORDER — ZEPBOUND 12.5 MG/0.5ML ~~LOC~~ SOAJ
12.5000 mg | SUBCUTANEOUS | 2 refills | Status: AC
  Filled 2024-09-30: qty 2, 28d supply, fill #0
  Filled 2024-11-08: qty 2, 28d supply, fill #1

## 2024-11-01 ENCOUNTER — Other Ambulatory Visit (HOSPITAL_BASED_OUTPATIENT_CLINIC_OR_DEPARTMENT_OTHER): Payer: Self-pay

## 2024-11-01 MED ORDER — TESTOSTERONE CYPIONATE 200 MG/ML IM SOLN
100.0000 mg | INTRAMUSCULAR | 3 refills | Status: AC
  Filled 2024-11-01: qty 4, 28d supply, fill #0

## 2024-11-01 MED ORDER — "BD ECLIPSE SYRINGE/NEEDLE 23G X 1-1/2"" 3 ML MISC"
0 refills | Status: AC
  Filled 2024-11-01: qty 4, 28d supply, fill #0

## 2024-11-04 ENCOUNTER — Other Ambulatory Visit (HOSPITAL_BASED_OUTPATIENT_CLINIC_OR_DEPARTMENT_OTHER): Payer: Self-pay

## 2024-11-08 ENCOUNTER — Other Ambulatory Visit (HOSPITAL_BASED_OUTPATIENT_CLINIC_OR_DEPARTMENT_OTHER): Payer: Self-pay

## 2024-11-09 ENCOUNTER — Other Ambulatory Visit (HOSPITAL_BASED_OUTPATIENT_CLINIC_OR_DEPARTMENT_OTHER): Payer: Self-pay

## 2024-11-11 ENCOUNTER — Other Ambulatory Visit (HOSPITAL_BASED_OUTPATIENT_CLINIC_OR_DEPARTMENT_OTHER): Payer: Self-pay
